# Patient Record
Sex: Female | Born: 1975 | Race: White | Hispanic: No | Marital: Married | Smoking: Former smoker
Health system: Southern US, Community
[De-identification: ages and names within clinical notes are randomized; demographics above are authoritative.]

## PROBLEM LIST (undated history)

## (undated) DIAGNOSIS — Z8742 Personal history of other diseases of the female genital tract: Secondary | ICD-10-CM

## (undated) DIAGNOSIS — G939 Disorder of brain, unspecified: Secondary | ICD-10-CM

## (undated) DIAGNOSIS — D649 Anemia, unspecified: Secondary | ICD-10-CM

## (undated) DIAGNOSIS — N3020 Other chronic cystitis without hematuria: Secondary | ICD-10-CM

## (undated) DIAGNOSIS — L709 Acne, unspecified: Secondary | ICD-10-CM

## (undated) DIAGNOSIS — K589 Irritable bowel syndrome without diarrhea: Secondary | ICD-10-CM

## (undated) DIAGNOSIS — G43109 Migraine with aura, not intractable, without status migrainosus: Secondary | ICD-10-CM

## (undated) DIAGNOSIS — L570 Actinic keratosis: Secondary | ICD-10-CM

## (undated) HISTORY — DX: Migraine with aura, not intractable, without status migrainosus: G43.109

## (undated) HISTORY — DX: Other chronic cystitis without hematuria: N30.20

## (undated) HISTORY — DX: Anemia, unspecified: D64.9

## (undated) HISTORY — DX: Personal history of other diseases of the female genital tract: Z87.42

## (undated) HISTORY — DX: Irritable bowel syndrome, unspecified: K58.9

## (undated) HISTORY — DX: Disorder of brain, unspecified: G93.9

## (undated) HISTORY — DX: Actinic keratosis: L57.0

## (undated) HISTORY — DX: Acne, unspecified: L70.9

## (undated) HISTORY — PX: LAPAROSCOPY: SHX197

---

## 2001-03-15 ENCOUNTER — Ambulatory Visit (HOSPITAL_COMMUNITY): Admission: RE | Admit: 2001-03-15 | Discharge: 2001-03-15 | Payer: Self-pay | Admitting: Obstetrics and Gynecology

## 2001-03-15 ENCOUNTER — Encounter (INDEPENDENT_AMBULATORY_CARE_PROVIDER_SITE_OTHER): Payer: Self-pay

## 2002-06-19 ENCOUNTER — Other Ambulatory Visit: Admission: RE | Admit: 2002-06-19 | Discharge: 2002-06-19 | Payer: Self-pay | Admitting: Obstetrics and Gynecology

## 2002-11-15 ENCOUNTER — Encounter: Admission: RE | Admit: 2002-11-15 | Discharge: 2002-11-15 | Payer: Self-pay | Admitting: Obstetrics and Gynecology

## 2002-11-21 ENCOUNTER — Inpatient Hospital Stay (HOSPITAL_COMMUNITY): Admission: AD | Admit: 2002-11-21 | Discharge: 2002-11-21 | Payer: Self-pay | Admitting: Obstetrics and Gynecology

## 2002-11-21 ENCOUNTER — Encounter: Admission: RE | Admit: 2002-11-21 | Discharge: 2002-11-21 | Payer: Self-pay | Admitting: Obstetrics and Gynecology

## 2002-11-27 ENCOUNTER — Encounter: Admission: RE | Admit: 2002-11-27 | Discharge: 2002-11-27 | Payer: Self-pay | Admitting: Obstetrics and Gynecology

## 2002-12-06 ENCOUNTER — Encounter: Admission: RE | Admit: 2002-12-06 | Discharge: 2002-12-06 | Payer: Self-pay | Admitting: Obstetrics and Gynecology

## 2002-12-11 ENCOUNTER — Encounter: Admission: RE | Admit: 2002-12-11 | Discharge: 2002-12-11 | Payer: Self-pay | Admitting: Obstetrics and Gynecology

## 2002-12-15 ENCOUNTER — Inpatient Hospital Stay (HOSPITAL_COMMUNITY): Admission: AD | Admit: 2002-12-15 | Discharge: 2002-12-15 | Payer: Self-pay | Admitting: Obstetrics and Gynecology

## 2002-12-16 ENCOUNTER — Inpatient Hospital Stay (HOSPITAL_COMMUNITY): Admission: AD | Admit: 2002-12-16 | Discharge: 2002-12-18 | Payer: Self-pay | Admitting: Obstetrics and Gynecology

## 2002-12-16 ENCOUNTER — Encounter (INDEPENDENT_AMBULATORY_CARE_PROVIDER_SITE_OTHER): Payer: Self-pay | Admitting: Specialist

## 2003-01-16 ENCOUNTER — Other Ambulatory Visit: Admission: RE | Admit: 2003-01-16 | Discharge: 2003-01-16 | Payer: Self-pay | Admitting: Obstetrics and Gynecology

## 2004-01-31 ENCOUNTER — Ambulatory Visit: Payer: Self-pay | Admitting: Internal Medicine

## 2004-01-31 LAB — HM COLONOSCOPY

## 2004-05-05 ENCOUNTER — Other Ambulatory Visit: Admission: RE | Admit: 2004-05-05 | Discharge: 2004-05-05 | Payer: Self-pay | Admitting: Obstetrics and Gynecology

## 2005-05-13 ENCOUNTER — Other Ambulatory Visit: Admission: RE | Admit: 2005-05-13 | Discharge: 2005-05-13 | Payer: Self-pay | Admitting: Obstetrics and Gynecology

## 2009-09-24 ENCOUNTER — Inpatient Hospital Stay (HOSPITAL_COMMUNITY): Admission: AD | Admit: 2009-09-24 | Discharge: 2009-09-27 | Payer: Self-pay | Admitting: Obstetrics and Gynecology

## 2009-10-01 ENCOUNTER — Inpatient Hospital Stay (HOSPITAL_COMMUNITY): Admission: AD | Admit: 2009-10-01 | Discharge: 2009-10-03 | Payer: Self-pay | Admitting: Obstetrics and Gynecology

## 2009-10-01 ENCOUNTER — Encounter (INDEPENDENT_AMBULATORY_CARE_PROVIDER_SITE_OTHER): Payer: Self-pay | Admitting: Obstetrics and Gynecology

## 2009-10-02 ENCOUNTER — Encounter: Admission: RE | Admit: 2009-10-02 | Discharge: 2009-10-22 | Payer: Self-pay | Admitting: Obstetrics and Gynecology

## 2009-11-02 ENCOUNTER — Encounter: Admission: RE | Admit: 2009-11-02 | Discharge: 2009-12-02 | Payer: Self-pay | Admitting: Obstetrics and Gynecology

## 2009-12-03 ENCOUNTER — Encounter: Admission: RE | Admit: 2009-12-03 | Discharge: 2009-12-29 | Payer: Self-pay | Admitting: Obstetrics and Gynecology

## 2010-07-12 LAB — URINALYSIS, ROUTINE W REFLEX MICROSCOPIC
Ketones, ur: NEGATIVE mg/dL
Leukocytes, UA: NEGATIVE
Nitrite: NEGATIVE
Protein, ur: NEGATIVE mg/dL
Specific Gravity, Urine: 1.02 (ref 1.005–1.030)
Urobilinogen, UA: 0.2 mg/dL (ref 0.0–1.0)
pH: 7 (ref 5.0–8.0)

## 2010-07-12 LAB — KLEIHAUER-BETKE STAIN: Quantitation Fetal Hemoglobin: 0 mL

## 2010-07-12 LAB — CBC
HCT: 29.6 % — ABNORMAL LOW (ref 36.0–46.0)
Hemoglobin: 11.1 g/dL — ABNORMAL LOW (ref 12.0–15.0)
MCV: 95.8 fL (ref 78.0–100.0)
MCV: 96.9 fL (ref 78.0–100.0)
Platelets: 202 10*3/uL (ref 150–400)
RBC: 3.69 MIL/uL — ABNORMAL LOW (ref 3.87–5.11)
RDW: 13.1 % (ref 11.5–15.5)
RDW: 13.2 % (ref 11.5–15.5)
WBC: 12.8 10*3/uL — ABNORMAL HIGH (ref 4.0–10.5)

## 2010-07-12 LAB — WET PREP, GENITAL
Clue Cells Wet Prep HPF POC: NONE SEEN
Trich, Wet Prep: NONE SEEN

## 2010-07-12 LAB — RPR: RPR Ser Ql: NONREACTIVE

## 2010-09-10 NOTE — Op Note (Signed)
[facility]  Patient:    Kathryn Maynard, Kathryn Maynard Visit Number: [id number] MRN: [id number]          Service Type: DSU Location: [facility] Attending Physician:  Frederich Balding Dictated by:   Juluis Mire, M.D. Proc. Date: 03/15/01 Admit Date:  03/15/2001                             Operative Report  PREOPERATIVE DIAGNOSES:       Abnormal bleeding and pelvic pain.  POSTOPERATIVE DIAGNOSES:      Minimal pelvic endometriosis.  OPERATIVE PROCEDURE:          Hysteroscopy with endometrial biopsies and curettings, laparoscopy with laser ablation of endometriotic implants.  SURGEON:                      Juluis Mire, M.D.  ANESTHESIA:                   General endotracheal.  ESTIMATED BLOOD LOSS:         Minimal.  PACKS AND DRAINS:             None.  INTRAOPERATIVE BLOOD PLACED:  None.  COMPLICATIONS:                None.  INDICATIONS:                  Dictated in the history and physical.  PROCEDURE:                    Patient was taken to the OR and placed in the supine position.  After a satisfactory level of general endotracheal anesthesia was obtained patient was placed in the dorsal lithotomy position using the Allen stirrups.  The abdomen, perineum, and vagina were prepped out with Betadine.  Patient was draped out for hysteroscopy.  Examination under anesthesia revealed the uterus to be anterior, normal size and shape.  Adnexa was unremarkable.  Speculum was placed in the vaginal vault.  The cervix was grasped with a single tooth tenaculum.  Uterus sounded to 8 cm.  Cervix was serially dilated to a size 35 Pratt dilator.  The operative hysteroscope was then introduced.  Intrauterine cavity was distended using sorbitol. Visualization revealed no evidence of polyps or fibroids.  She did have thin, normal appearing endometrium.  Two biopsies were obtained along with endometrial curettings.  The hysteroscope was then removed.  The Hulka tenaculum  was then put in place.  Patients bladder was then emptied by in-and-out catheterization.  Patient was then draped out for laparoscopy.  Subumbilical incision was made with the knife.  The Veress needle was introduced into the abdominal cavity.  Abdomen was insufflated with approximately 3 L of carbon dioxide.  The operating laparoscope was introduced.  Visualization revealed no evidence of injury to adjacent organs. A 5 mm trocar was put in place in the suprapubic area under direct visualization.  The appendix was retrocecal, but normal.  Upper abdomen including liver and tip of the gallbladder were clear.  Uterus was of normal size and shape.  It did have superficial implants of endometriosis on the anterior and fundal area.  These were small whitish discolored papules.  She had two papules along the left uterosacral ligament.  The ovaries and tubes were unremarkable and not involved in any type of adhesions or endometriotic implants.  Using the ______ laser  with small rounded sapphire tip, the areas of endometriosis on the anterior and fundal area of the uterus were ablated superficially and the two along the left uterosacral ligament were ablated. At the end of the procedure we had to ablate all active endometriosis.  There was no active bleeding or evidence of injury to adjacent organs.  We thoroughly irrigated the pelvis.  The irrigation was then removed.  The patient was taken out of the dorsal lithotomy position.  Once she was alert and extubated, she was transferred to the recovery room in good condition. The Hulka tenaculum had been removed and sponge, instrument, and needle count was reported correct by the circulated nurse x 2. Dictated by:   Juluis Mire, M.D. Attending Physician:  Frederich Balding DD:  03/15/01 TD:  03/15/01 Job: [id number]

## 2010-09-10 NOTE — H&P (Signed)
[facility]  PatientTRELLIS, GUIRGUIS Visit Number: [id number] MRN: [id number]          Service Type: Attending:  Juluis Mire, M.D. Dictated by:   Juluis Mire, M.D. Adm. Date:  03/15/01                           History and Physical  REASON FOR ADMISSION:         The patient is a 35 year old gravida 2 para 1 abortus 1 married white female who presents for a hysteroscopy along with diagnostic laparoscopy with laser standby.  HISTORY OF PRESENT ILLNESS:   Patient initially seen in our practice on September 26, 2000.  At that time she was on birth control pills.  She was having problems with intermenstrual spotting.  This bleeding seems to increase with intercourse and was associated with increasing right lower quadrant pain. During deep penetration she had some fairly significant discomfort that at times was becoming fairly limiting for her.  Due to the abnormal bleeding on the pills and the pelvic pain and dyspareunia, we switched her pills around and treated her with antibiotics.  We also did an ultrasound.  On ultrasound there was no evidence of any intrauterine pathology; specifically, no polyps and fibroids and both ovaries appeared to be normal.  The patient continued to have some problems with breakthrough bleeding and particularly pain with intercourse, and because of this she now presents to rule out endometriosis and is set up for the above-noted surgery.  ALLERGIES:                    PENICILLIN.  MEDICATIONS:                  Birth control pills.  PAST MEDICAL HISTORY:         History of frequent urinary tract infection. Otherwise, usual childhood diseases.  SURGICAL HISTORY:             She has had one D&C for a spontaneous abortion in 1997.  OBSTETRICAL HISTORY:          She has had one spontaneous vaginal delivery.  SOCIAL HISTORY:               She does smoke a half a pack per day.  No alcohol use.  FAMILY HISTORY:                Paternal grandmother with diabetes.  Mother, father, and sister all have hypertension.  Paternal grandmother with heart disease.  REVIEW OF SYSTEMS:            Noncontributory.  PHYSICAL EXAMINATION:  VITAL SIGNS:                  Patient is afebrile with stable vital signs.  HEENT:                        Patient normocephalic.  Pupils equal, round, and reactive to light and accommodation.  Extraocular movements were intact. Sclerae and conjunctivae were clear.  Oropharynx clear.  NECK:                         Without thyromegaly.  BREASTS:                      Not examined.  LUNGS:  Clear.  CARDIOVASCULAR:               Regular rhythm and rate, no murmurs or gallops.  ABDOMEN:                      Benign.  PELVIC:                       Normal external genitalia, vaginal mucosa is clear.  Cervix unremarkable.  Uterus feels to be mid position, mild cul-de-sac tenderness.  Adnexa unremarkable.  EXTREMITIES:                  Trace edema.  NEUROLOGIC:                   Grossly within normal limits.  IMPRESSION:                   Abnormal uterine bleeding on pills, as well as increasing pelvic pain and dyspareunia, rule out endometriosis.  PLAN:                         The patient to undergo hysteroscopy along with diagnostic laparoscopy with laser standby for evaluation.  The risks have been discussed, including the risk of anesthesia; the risk of infection; the risk of hemorrhage that could require transfusion with the risk of AIDS or hepatitis; the risk of injury to adjacent organs including bladder, bowel, or ureters that could require further exploratory surgery; the risk of deep venous thrombosis and pulmonary embolus.  The patient professed an understanding of the indications and risks. Dictated by:   Juluis Mire, M.D. Attending:  Juluis Mire, M.D. DD:  03/15/01 TD:  03/15/01 Job: [id number]

## 2011-06-13 ENCOUNTER — Ambulatory Visit: Payer: Self-pay | Admitting: Internal Medicine

## 2014-03-07 ENCOUNTER — Ambulatory Visit: Payer: Self-pay | Admitting: Physician Assistant

## 2014-06-03 ENCOUNTER — Ambulatory Visit: Payer: Self-pay | Admitting: Otolaryngology

## 2015-09-15 DIAGNOSIS — R310 Gross hematuria: Secondary | ICD-10-CM | POA: Diagnosis not present

## 2015-09-15 DIAGNOSIS — R3989 Other symptoms and signs involving the genitourinary system: Secondary | ICD-10-CM | POA: Diagnosis not present

## 2015-09-15 DIAGNOSIS — Z6821 Body mass index (BMI) 21.0-21.9, adult: Secondary | ICD-10-CM | POA: Diagnosis not present

## 2015-09-15 DIAGNOSIS — N3020 Other chronic cystitis without hematuria: Secondary | ICD-10-CM | POA: Diagnosis not present

## 2015-10-08 DIAGNOSIS — K7689 Other specified diseases of liver: Secondary | ICD-10-CM | POA: Diagnosis not present

## 2015-10-08 DIAGNOSIS — K5730 Diverticulosis of large intestine without perforation or abscess without bleeding: Secondary | ICD-10-CM | POA: Diagnosis not present

## 2015-10-08 DIAGNOSIS — R310 Gross hematuria: Secondary | ICD-10-CM | POA: Diagnosis not present

## 2015-12-09 DIAGNOSIS — Z6821 Body mass index (BMI) 21.0-21.9, adult: Secondary | ICD-10-CM | POA: Diagnosis not present

## 2015-12-09 DIAGNOSIS — G43909 Migraine, unspecified, not intractable, without status migrainosus: Secondary | ICD-10-CM | POA: Diagnosis not present

## 2015-12-09 DIAGNOSIS — Z01419 Encounter for gynecological examination (general) (routine) without abnormal findings: Secondary | ICD-10-CM | POA: Diagnosis not present

## 2016-04-13 DIAGNOSIS — J069 Acute upper respiratory infection, unspecified: Secondary | ICD-10-CM | POA: Diagnosis not present

## 2016-05-20 ENCOUNTER — Encounter: Payer: Self-pay | Admitting: Family Medicine

## 2016-05-20 ENCOUNTER — Ambulatory Visit (INDEPENDENT_AMBULATORY_CARE_PROVIDER_SITE_OTHER): Payer: BLUE CROSS/BLUE SHIELD | Admitting: Family Medicine

## 2016-05-20 VITALS — BP 143/91 | HR 63 | Temp 98.0°F | Wt 119.0 lb

## 2016-05-20 DIAGNOSIS — N3020 Other chronic cystitis without hematuria: Secondary | ICD-10-CM

## 2016-05-20 DIAGNOSIS — G939 Disorder of brain, unspecified: Secondary | ICD-10-CM

## 2016-05-20 DIAGNOSIS — D649 Anemia, unspecified: Secondary | ICD-10-CM | POA: Insufficient documentation

## 2016-05-20 DIAGNOSIS — K589 Irritable bowel syndrome without diarrhea: Secondary | ICD-10-CM | POA: Insufficient documentation

## 2016-05-20 DIAGNOSIS — N390 Urinary tract infection, site not specified: Secondary | ICD-10-CM | POA: Diagnosis not present

## 2016-05-20 DIAGNOSIS — G43109 Migraine with aura, not intractable, without status migrainosus: Secondary | ICD-10-CM | POA: Insufficient documentation

## 2016-05-20 DIAGNOSIS — R399 Unspecified symptoms and signs involving the genitourinary system: Secondary | ICD-10-CM | POA: Diagnosis not present

## 2016-05-20 MED ORDER — CIPROFLOXACIN HCL 250 MG PO TABS: 250.0000 mg | ORAL_TABLET | Freq: Two times a day (BID) | ORAL | 0 refills | Status: DC

## 2016-05-20 MED ORDER — PHENAZOPYRIDINE HCL 95 MG PO TABS: 95.0000 mg | ORAL_TABLET | Freq: Three times a day (TID) | ORAL | 0 refills | Status: DC | PRN

## 2016-05-20 NOTE — Patient Instructions (Signed)
Follow up as needed

## 2016-05-20 NOTE — Progress Notes (Signed)
   BP (!) 143/91   Pulse 63   Temp 98 F (36.7 C)   Wt 119 lb (54 kg)   SpO2 95%   BMI 23.24 kg/m    Subjective:    Patient ID: Kathryn Maynard, female    DOB: [birth year], 41 y.o.   MRN: [id number]  HPI: Kathryn Maynard is a 41 y.o. female  Chief Complaint  Patient presents with  . Urinary Tract Infection    x 1 week, burning, frequency, urgency, only a dribble at a time. abdomen fullness, uncomfortable.   Patient presents with about 1 week of urinary frequency, urgency, dysuria, hematuria, lbp, and pelvic pressure. Denies N/V, fever, chills. Has a long hx of UTIs, full work-up with Urology last year negative. Was on suppressive abx for 3 months in the fall but has since stopped d/t yeast infections. Had one antibiotic tablet left from a previous script that she took this morning for current sxs.  Relevant past medical, surgical, family and social history reviewed and updated as indicated. Interim medical history since our last visit reviewed. Allergies and medications reviewed and updated.  Review of Systems  Constitutional: Negative.   HENT: Negative.   Eyes: Negative.   Respiratory: Negative.   Cardiovascular: Negative.   Gastrointestinal: Negative.   Genitourinary: Positive for dysuria, frequency, hematuria, pelvic pain (pressure) and urgency.  Musculoskeletal: Positive for back pain.  Neurological: Negative.   Psychiatric/Behavioral: Negative.     Per HPI unless specifically indicated above     Objective:    BP (!) 143/91   Pulse 63   Temp 98 F (36.7 C)   Wt 119 lb (54 kg)   SpO2 95%   BMI 23.24 kg/m   Wt Readings from Last 3 Encounters:  05/20/16 119 lb (54 kg)  05/22/14 118 lb (53.5 kg)    Physical Exam  Constitutional: She is oriented to person, place, and time. She appears well-developed and well-nourished. No distress.  HENT:  Head: Atraumatic.  Eyes: Conjunctivae are normal. Pupils are equal, round, and reactive to light.  Neck: Normal  range of motion. Neck supple.  Cardiovascular: Normal rate and normal heart sounds.   Pulmonary/Chest: Effort normal and breath sounds normal. No respiratory distress.  Musculoskeletal: Normal range of motion.  No CVA tenderness  Neurological: She is alert and oriented to person, place, and time.  Skin: Skin is warm and dry.  Psychiatric: She has a normal mood and affect. Her behavior is normal.  Nursing note and vitals reviewed.     Assessment & Plan:   Problem List Items Addressed This Visit    None    Visit Diagnoses    Lower urinary tract infection    -  Primary   U/A +, will start cipro and await cx. Discussed probiotic, cranberry, push fluids, uristat for pain. F/u with Urology if symptoms return   Relevant Medications   phenazopyridine (URISTAT) 95 MG tablet   Other Relevant Orders   UA/M w/rflx Culture, Routine (STAT)       Follow up plan: Return if symptoms worsen or fail to improve.

## 2016-05-27 ENCOUNTER — Telehealth: Payer: Self-pay | Admitting: Family Medicine

## 2016-05-27 LAB — UA/M W/RFLX CULTURE, ROUTINE
BILIRUBIN UA: NEGATIVE
Glucose, UA: NEGATIVE
KETONES UA: NEGATIVE
Nitrite, UA: NEGATIVE
Specific Gravity, UA: 1.025 (ref 1.005–1.030)
UUROB: 0.2 mg/dL (ref 0.2–1.0)
pH, UA: 5.5 (ref 5.0–7.5)

## 2016-05-27 LAB — URINE CULTURE, REFLEX

## 2016-05-27 LAB — MICROSCOPIC EXAMINATION

## 2016-05-27 NOTE — Telephone Encounter (Signed)
Patient notified. Advised her to let us know if her symptoms return.

## 2016-05-27 NOTE — Telephone Encounter (Signed)
Please call patient and let her know that her urine culture came back showing that the bacteria is susceptible to the cipro, so we shouldn't have to make any changes. She should return for another U/A if still symptomatic after completing course. Thanks

## 2016-06-27 DIAGNOSIS — N760 Acute vaginitis: Secondary | ICD-10-CM | POA: Diagnosis not present

## 2016-06-27 DIAGNOSIS — N390 Urinary tract infection, site not specified: Secondary | ICD-10-CM | POA: Diagnosis not present

## 2016-08-01 DIAGNOSIS — R3129 Other microscopic hematuria: Secondary | ICD-10-CM | POA: Diagnosis not present

## 2016-08-01 DIAGNOSIS — R310 Gross hematuria: Secondary | ICD-10-CM | POA: Diagnosis not present

## 2016-08-01 DIAGNOSIS — R300 Dysuria: Secondary | ICD-10-CM | POA: Diagnosis not present

## 2016-08-01 DIAGNOSIS — Z6821 Body mass index (BMI) 21.0-21.9, adult: Secondary | ICD-10-CM | POA: Diagnosis not present

## 2016-08-01 DIAGNOSIS — R3915 Urgency of urination: Secondary | ICD-10-CM | POA: Diagnosis not present

## 2016-08-01 DIAGNOSIS — N3020 Other chronic cystitis without hematuria: Secondary | ICD-10-CM | POA: Diagnosis not present

## 2016-08-02 DIAGNOSIS — R310 Gross hematuria: Secondary | ICD-10-CM | POA: Diagnosis not present

## 2016-08-02 DIAGNOSIS — N3020 Other chronic cystitis without hematuria: Secondary | ICD-10-CM | POA: Diagnosis not present

## 2016-08-02 DIAGNOSIS — R3989 Other symptoms and signs involving the genitourinary system: Secondary | ICD-10-CM | POA: Diagnosis not present

## 2016-08-02 DIAGNOSIS — Z6821 Body mass index (BMI) 21.0-21.9, adult: Secondary | ICD-10-CM | POA: Diagnosis not present

## 2016-10-31 DIAGNOSIS — N760 Acute vaginitis: Secondary | ICD-10-CM | POA: Diagnosis not present

## 2016-10-31 DIAGNOSIS — Z87440 Personal history of urinary (tract) infections: Secondary | ICD-10-CM | POA: Diagnosis not present

## 2016-10-31 DIAGNOSIS — Z01818 Encounter for other preprocedural examination: Secondary | ICD-10-CM | POA: Diagnosis not present

## 2016-12-22 DIAGNOSIS — Z01419 Encounter for gynecological examination (general) (routine) without abnormal findings: Secondary | ICD-10-CM | POA: Diagnosis not present

## 2016-12-22 DIAGNOSIS — Z6821 Body mass index (BMI) 21.0-21.9, adult: Secondary | ICD-10-CM | POA: Diagnosis not present

## 2016-12-28 DIAGNOSIS — Z1231 Encounter for screening mammogram for malignant neoplasm of breast: Secondary | ICD-10-CM | POA: Diagnosis not present

## 2017-05-18 DIAGNOSIS — L811 Chloasma: Secondary | ICD-10-CM | POA: Diagnosis not present

## 2017-05-18 DIAGNOSIS — Z85828 Personal history of other malignant neoplasm of skin: Secondary | ICD-10-CM | POA: Diagnosis not present

## 2017-05-18 DIAGNOSIS — L700 Acne vulgaris: Secondary | ICD-10-CM | POA: Diagnosis not present

## 2018-02-18 ENCOUNTER — Ambulatory Visit
Admission: EM | Admit: 2018-02-18 | Discharge: 2018-02-18 | Disposition: A | Discharge status: Home / Self Care | Payer: BLUE CROSS/BLUE SHIELD | Attending: Family Medicine | Admitting: Family Medicine

## 2018-02-18 ENCOUNTER — Other Ambulatory Visit: Payer: Self-pay

## 2018-02-18 DIAGNOSIS — J029 Acute pharyngitis, unspecified: Secondary | ICD-10-CM

## 2018-02-18 LAB — RAPID STREP SCREEN (MED CTR MEBANE ONLY): Streptococcus, Group A Screen (Direct): NEGATIVE

## 2018-02-18 MED ORDER — LIDOCAINE VISCOUS HCL 2 % MT SOLN: OROMUCOSAL | 0 refills | Status: DC

## 2018-02-18 NOTE — ED Provider Notes (Signed)
[facility]    CSN: [id number] Arrival date & time: 02/18/18  1023  History   Chief Complaint Chief Complaint  Patient presents with  . Sore Throat   HPI  42 year old female presents with sore throat and headache.  Also reports ear pain.  Started yesterday.  Reports severe sore throat.  Associated headache.  Associated ear pain, particularly the right ear.  No fever.  No reported sick contacts.  No known exacerbating or relieving factors.  No other complaints.  Past Medical History:  Diagnosis Date  . Anemia   . Chronic cystitis   . History of endometriosis   . IBS (irritable bowel syndrome)   . Lesion of pons    followed by neuro  . Migraine headache with aura followed by neuro   Patient Active Problem List   Diagnosis Date Noted  . Migraine headache with aura   . Lesion of pons   . Anemia   . IBS (irritable bowel syndrome)   . Chronic cystitis    Past Surgical History:  Procedure Laterality Date  . LAPAROSCOPY     for endometriosis   OB History   None    Home Medications    Prior to Admission medications   Medication Sig Start Date End Date Taking? Authorizing Provider  doxycycline (VIBRA-TABS) 100 MG tablet TAKE 1 TABLET BY MOUTH DAILY WITH FOOD AND PLENTY OF FLUID 02/02/18   [provider]  lidocaine (XYLOCAINE) 2 % solution Gargle 15 mL every 3 hours as needed. May swallow if desired. 02/18/18   Coral Spikes, DO  norethindrone-ethinyl estradiol (MICROGESTIN,JUNEL,LOESTRIN) 1-20 MG-MCG tablet Take by mouth daily. 05/31/14   [provider]  spironolactone (ALDACTONE) 25 MG tablet Take 25 mg by mouth daily. 01/22/18   [provider]  SUMAtriptan (IMITREX) 100 MG tablet  02/08/18   [provider]   Family History Family History  Problem Relation Age of Onset  . Heart disease Mother   . Heart attack Mother   . Diabetes Father   . Hypertension Father   . Diabetes Sister   . Hypertension Sister   . Drug  abuse Paternal Grandmother    Social History Social History   Tobacco Use  . Smoking status: Former Research scientist (life sciences)  . Smokeless tobacco: Never Used  . Tobacco comment: use no nicotine vapor  Substance Use Topics  . Alcohol use: No  . Drug use: No   Allergies   Penicillin g   Review of Systems Review of Systems  Constitutional: Negative for fever.  HENT: Positive for ear pain and sore throat.   Neurological: Positive for headaches.   Physical Exam Triage Vital Signs ED Triage Vitals  Enc Vitals Group     BP 02/18/18 1040 116/73     Pulse Rate 02/18/18 1040 66     Resp 02/18/18 1040 16     Temp 02/18/18 1040 98.3 F (36.8 C)     Temp Source 02/18/18 1040 Oral     SpO2 02/18/18 1040 100 %     Weight 02/18/18 1036 120 lb (54.4 kg)     Height 02/18/18 1036 5' 1"  (1.549 m)     Head Circumference --      Peak Flow --      Pain Score 02/18/18 1035 5     Pain Loc --      Pain Edu? --      Excl. in [place]? --    Updated Vital Signs BP 116/73 (BP Location:  Left Arm)   Pulse 66   Temp 98.3 F (36.8 C) (Oral)   Resp 16   Ht 5' 1"  (1.549 m)   Wt 54.4 kg   SpO2 100%   BMI 22.67 kg/m   Visual Acuity Right Eye Distance:   Left Eye Distance:   Bilateral Distance:    Right Eye Near:   Left Eye Near:    Bilateral Near:     Physical Exam  Constitutional: She is oriented to person, place, and time. She appears well-developed. No distress.  HENT:  Head: Normocephalic and atraumatic.  Oropharynx with mild erythema.  Tonsillith noted on the right. Right TM obscured by cerumen.  Left TM normal.  After cerumen disimpaction, right TM normal.  Cardiovascular: Normal rate and regular rhythm.  Pulmonary/Chest: Effort normal and breath sounds normal. She has no wheezes. She has no rales.  Neurological: She is alert and oriented to person, place, and time.  Psychiatric: She has a normal mood and affect. Her behavior is normal.  Nursing note and vitals reviewed.  UC Treatments / Results   Labs (all labs ordered are listed, but only abnormal results are displayed) Labs Reviewed  RAPID STREP SCREEN ([facility] ONLY)  CULTURE, GROUP A STREP [facility])    EKG None  Radiology No results found.  Procedures Procedures (including critical care time)  Medications Ordered in UC Medications - No data to display  Initial Impression / Assessment and Plan / UC Course  I have reviewed the triage vital signs and the nursing notes.  Pertinent labs & imaging results that were available during my care of the patient were reviewed by me and considered in my medical decision making (see chart for details).    42 year old female presents with viral pharyngitis.  Viscous lidocaine as needed.  Supportive care.  Final Clinical Impressions(s) / UC Diagnoses   Final diagnoses:  Viral pharyngitis     Discharge Instructions     Rest, fluids.  Warm salt water gargles. Ibuprofen as needed.   Medication as prescribed.  Take care  Dr. Lacinda Axon    ED Prescriptions    Medication Sig Dispense Auth. Provider   lidocaine (XYLOCAINE) 2 % solution Gargle 15 mL every 3 hours as needed. May swallow if desired. 200 mL Coral Spikes, DO     Controlled Substance Prescriptions [place] Controlled Substance Registry consulted? Not Applicable   Coral Spikes, DO 02/18/18 1303

## 2018-02-18 NOTE — ED Triage Notes (Signed)
Patient with sore throat pain since yesterday and radiating into right ear and head. Pain 5/10

## 2018-02-18 NOTE — Discharge Instructions (Signed)
Rest, fluids.  Warm salt water gargles. Ibuprofen as needed.   Medication as prescribed.  Take care  Dr. Lacinda Axon

## 2018-02-21 LAB — CULTURE, GROUP A STREP (THRC)

## 2018-02-23 ENCOUNTER — Telehealth (HOSPITAL_COMMUNITY): Payer: Self-pay

## 2018-02-23 NOTE — Telephone Encounter (Signed)
Culture is positive for non group A Strep germ.  This is a finding of uncertain significance; not the typical 'strep throat' germ.  Pt reports feeling better.

## 2018-04-23 DIAGNOSIS — L700 Acne vulgaris: Secondary | ICD-10-CM | POA: Diagnosis not present

## 2018-06-27 DIAGNOSIS — L570 Actinic keratosis: Secondary | ICD-10-CM | POA: Diagnosis not present

## 2018-06-27 DIAGNOSIS — L700 Acne vulgaris: Secondary | ICD-10-CM | POA: Diagnosis not present

## 2018-08-30 DIAGNOSIS — L700 Acne vulgaris: Secondary | ICD-10-CM | POA: Diagnosis not present

## 2018-08-30 DIAGNOSIS — Z79899 Other long term (current) drug therapy: Secondary | ICD-10-CM | POA: Diagnosis not present

## 2018-08-30 DIAGNOSIS — L578 Other skin changes due to chronic exposure to nonionizing radiation: Secondary | ICD-10-CM | POA: Diagnosis not present

## 2018-08-30 DIAGNOSIS — L570 Actinic keratosis: Secondary | ICD-10-CM | POA: Diagnosis not present

## 2018-10-01 DIAGNOSIS — L570 Actinic keratosis: Secondary | ICD-10-CM | POA: Diagnosis not present

## 2018-10-01 DIAGNOSIS — Z79899 Other long term (current) drug therapy: Secondary | ICD-10-CM | POA: Diagnosis not present

## 2018-10-01 DIAGNOSIS — L700 Acne vulgaris: Secondary | ICD-10-CM | POA: Diagnosis not present

## 2018-11-05 DIAGNOSIS — Z79899 Other long term (current) drug therapy: Secondary | ICD-10-CM | POA: Diagnosis not present

## 2018-11-05 DIAGNOSIS — L700 Acne vulgaris: Secondary | ICD-10-CM | POA: Diagnosis not present

## 2018-11-05 DIAGNOSIS — L853 Xerosis cutis: Secondary | ICD-10-CM | POA: Diagnosis not present

## 2018-11-05 DIAGNOSIS — D485 Neoplasm of uncertain behavior of skin: Secondary | ICD-10-CM | POA: Diagnosis not present

## 2018-11-05 DIAGNOSIS — L570 Actinic keratosis: Secondary | ICD-10-CM | POA: Diagnosis not present

## 2018-11-09 DIAGNOSIS — H16102 Unspecified superficial keratitis, left eye: Secondary | ICD-10-CM | POA: Diagnosis not present

## 2018-12-12 DIAGNOSIS — L700 Acne vulgaris: Secondary | ICD-10-CM | POA: Diagnosis not present

## 2018-12-12 DIAGNOSIS — Z79899 Other long term (current) drug therapy: Secondary | ICD-10-CM | POA: Diagnosis not present

## 2018-12-12 DIAGNOSIS — L570 Actinic keratosis: Secondary | ICD-10-CM | POA: Diagnosis not present

## 2018-12-12 DIAGNOSIS — L400 Psoriasis vulgaris: Secondary | ICD-10-CM | POA: Diagnosis not present

## 2019-01-14 DIAGNOSIS — Z872 Personal history of diseases of the skin and subcutaneous tissue: Secondary | ICD-10-CM | POA: Diagnosis not present

## 2019-01-14 DIAGNOSIS — R591 Generalized enlarged lymph nodes: Secondary | ICD-10-CM | POA: Diagnosis not present

## 2019-01-14 DIAGNOSIS — L700 Acne vulgaris: Secondary | ICD-10-CM | POA: Diagnosis not present

## 2019-01-14 DIAGNOSIS — L578 Other skin changes due to chronic exposure to nonionizing radiation: Secondary | ICD-10-CM | POA: Diagnosis not present

## 2019-02-14 DIAGNOSIS — L853 Xerosis cutis: Secondary | ICD-10-CM | POA: Diagnosis not present

## 2019-02-14 DIAGNOSIS — Z872 Personal history of diseases of the skin and subcutaneous tissue: Secondary | ICD-10-CM | POA: Diagnosis not present

## 2019-02-14 DIAGNOSIS — L700 Acne vulgaris: Secondary | ICD-10-CM | POA: Diagnosis not present

## 2019-02-14 DIAGNOSIS — Z79899 Other long term (current) drug therapy: Secondary | ICD-10-CM | POA: Diagnosis not present

## 2019-02-15 DIAGNOSIS — Z01419 Encounter for gynecological examination (general) (routine) without abnormal findings: Secondary | ICD-10-CM | POA: Diagnosis not present

## 2019-02-15 DIAGNOSIS — Z6823 Body mass index (BMI) 23.0-23.9, adult: Secondary | ICD-10-CM | POA: Diagnosis not present

## 2019-03-18 DIAGNOSIS — L700 Acne vulgaris: Secondary | ICD-10-CM | POA: Diagnosis not present

## 2019-03-18 DIAGNOSIS — L853 Xerosis cutis: Secondary | ICD-10-CM | POA: Diagnosis not present

## 2019-03-18 DIAGNOSIS — L539 Erythematous condition, unspecified: Secondary | ICD-10-CM | POA: Diagnosis not present

## 2019-03-18 DIAGNOSIS — Z79899 Other long term (current) drug therapy: Secondary | ICD-10-CM | POA: Diagnosis not present

## 2019-04-11 DIAGNOSIS — Z872 Personal history of diseases of the skin and subcutaneous tissue: Secondary | ICD-10-CM | POA: Diagnosis not present

## 2019-04-11 DIAGNOSIS — L700 Acne vulgaris: Secondary | ICD-10-CM | POA: Diagnosis not present

## 2019-04-11 DIAGNOSIS — Z79899 Other long term (current) drug therapy: Secondary | ICD-10-CM | POA: Diagnosis not present

## 2019-04-16 ENCOUNTER — Ambulatory Visit: Payer: Self-pay

## 2019-04-16 NOTE — Telephone Encounter (Signed)
Pt experienced burning with urination this morning and bladder pressure. Pt has a hx of chronic UTI's. Pt has appointment tomorrow but was calling for advice on how to manage sx until on abx.  Pt took a dose of Uristat prior to call. Advised pt to drink plenty of water, avoid coffee, alcohol and soft drinks that contain citrus juices and caffeine. Advised pt to place a heating pad over her abdomen to help ease pressure.  Pt verbalized understanding.  Reason for Disposition . [1] Painful urination AND [2] EITHER frequency or urgency AND [3] has on-call doctor  Answer Assessment - Initial Assessment Questions 1. SYMPTOM: "What's the main symptom you're concerned about?" (e.g., frequency, incontinence)     Burning with urination 2. ONSET: "When did the  burning  start?"     This am 3. PAIN: "Is there any pain?" If so, ask: "How bad is it?" (Scale: 1-10; mild, moderate, severe)     mild 4. CAUSE: "What do you think is causing the symptoms?"     UTI 5. OTHER SYMPTOMS: "Do you have any other symptoms?" (e.g., fever, flank pain, blood in urine, pain with urination)    Pain with urination and bladder pressure 6. PREGNANCY: "Is there any chance you are pregnant?" "When was your last menstrual period?"  Protocols used: [place], URINARY [facility]

## 2019-04-17 ENCOUNTER — Ambulatory Visit: Payer: BC Managed Care – PPO | Admitting: Family Medicine

## 2019-04-17 ENCOUNTER — Encounter: Payer: Self-pay | Admitting: Family Medicine

## 2019-04-17 ENCOUNTER — Other Ambulatory Visit: Payer: Self-pay

## 2019-04-17 VITALS — BP 115/74 | HR 69 | Temp 99.0°F | Ht 61.0 in | Wt 124.0 lb

## 2019-04-17 DIAGNOSIS — N390 Urinary tract infection, site not specified: Secondary | ICD-10-CM

## 2019-04-17 DIAGNOSIS — R42 Dizziness and giddiness: Secondary | ICD-10-CM | POA: Diagnosis not present

## 2019-04-17 DIAGNOSIS — R300 Dysuria: Secondary | ICD-10-CM | POA: Diagnosis not present

## 2019-04-17 MED ORDER — FLUCONAZOLE 150 MG PO TABS
150.0000 mg | ORAL_TABLET | Freq: Every day | ORAL | 0 refills | Status: DC
Start: 2019-04-17 — End: 2023-06-22

## 2019-04-17 MED ORDER — NITROFURANTOIN MONOHYD MACRO 100 MG PO CAPS
100.0000 mg | ORAL_CAPSULE | Freq: Two times a day (BID) | ORAL | 0 refills | Status: DC
Start: 2019-04-17 — End: 2023-06-22

## 2019-04-17 NOTE — Progress Notes (Signed)
BP 115/74   Pulse 69   Temp 99 F (37.2 C) (Oral)   Ht 5' 1"  (1.549 m)   Wt 124 lb (56.2 kg)   SpO2 98%   BMI 23.43 kg/m    Subjective:    Patient ID: Kathryn Maynard, female    DOB: 09-18-1975, 43 y.o.   MRN: [id number]  HPI: Kathryn Maynard is a 43 y.o. female  Chief Complaint  Patient presents with  . Dysuria    pressure, burning and discomfort when urinate since yesterday   Patient presenting today with 1 day of pelvic pressure, dysuria, and urinary frequency. Long hx of issues with UTIs, but has not had one in over a year now. Denies fever, chills, N/V/D, concern for STI or pregnancy. Trying AZO with mild temporary relief.   Notes an episode several days ago that shook her up where she was sitting at her desk at work when she all of a sudden had a lightheadedness spell and the sensation of falling, then room spinning dizziness and poor equilibrium. Sxs resolved within seconds per patient and has not had any recurrences since this. Did not go to ED at time and does not wish to at this time unless absolutely having to. Denies confusion, syncope, current headache. Does have hx of migraines for which she takes imitrex prn. No other known neurologic issues noted.   Relevant past medical, surgical, family and social history reviewed and updated as indicated. Interim medical history since our last visit reviewed. Allergies and medications reviewed and updated.  Review of Systems  Per HPI unless specifically indicated above     Objective:    BP 115/74   Pulse 69   Temp 99 F (37.2 C) (Oral)   Ht 5' 1"  (1.549 m)   Wt 124 lb (56.2 kg)   SpO2 98%   BMI 23.43 kg/m   Wt Readings from Last 3 Encounters:  04/17/19 124 lb (56.2 kg)  02/18/18 120 lb (54.4 kg)  05/20/16 119 lb (54 kg)    Physical Exam Vitals and nursing note reviewed.  Constitutional:      Appearance: Normal appearance. She is not ill-appearing.  HENT:     Head: Atraumatic.  Eyes:     Extraocular  Movements: Extraocular movements intact.     Conjunctiva/sclera: Conjunctivae normal.  Cardiovascular:     Rate and Rhythm: Normal rate and regular rhythm.     Heart sounds: Normal heart sounds.  Pulmonary:     Effort: Pulmonary effort is normal.     Breath sounds: Normal breath sounds.  Abdominal:     General: Bowel sounds are normal. There is no distension.     Palpations: Abdomen is soft.     Tenderness: There is no abdominal tenderness. There is no right CVA tenderness, left CVA tenderness or guarding.  Musculoskeletal:        General: Normal range of motion.     Cervical back: Normal range of motion and neck supple.  Skin:    General: Skin is warm and dry.  Neurological:     General: No focal deficit present.     Mental Status: She is alert and oriented to person, place, and time.     Cranial Nerves: No cranial nerve deficit.     Motor: No weakness.     Coordination: Coordination normal.     Gait: Gait normal.  Psychiatric:        Mood and Affect: Mood normal.  Thought Content: Thought content normal.        Judgment: Judgment normal.     Results for orders placed or performed in visit on 04/17/19  Microscopic Examination   URINE  Result Value Ref Range   WBC, UA 0-5 0 - 5 /hpf   RBC None seen 0 - 2 /hpf   Epithelial Cells (non renal) 0-10 0 - 10 /hpf   Mucus, UA Present Not Estab.   Bacteria, UA Few (A) None seen/Few  Urine Culture, Reflex   URINE  Result Value Ref Range   Urine Culture, Routine Final report    Organism ID, Bacteria Comment   UA/M w/rflx Culture, Routine   Specimen: Urine   URINE  Result Value Ref Range   Color, UA Red (A) Yellow   Appearance Ur Clear Clear   Microscopic Examination See below:    Urinalysis Reflex Comment       Assessment & Plan:   Problem List Items Addressed This Visit    None    Visit Diagnoses    Acute lower UTI    -  Primary   Tx with macrobid, await urine culture. Push fluids, AZO prn   Relevant  Medications   nitrofurantoin, macrocrystal-monohydrate, (MACROBID) 100 MG capsule   fluconazole (DIFLUCAN) 150 MG tablet   Other Relevant Orders   UA/M w/rflx Culture, Routine (Completed)   Dizziness       Options reviewed, including ER, Neurology referra, and MRI - pt wishing to hold off on workup for now and monitor for further sxs as sxs resolved spontaneously    Greater than 25 minutes spent today in direct care and counseling with patient. Discussed driving precautions given her unclear etiology of dizzy spell and strict return precautions if sxs recurring in any way   Follow up plan: Return if symptoms worsen or fail to improve.

## 2019-04-20 LAB — UA/M W/RFLX CULTURE, ROUTINE

## 2019-04-20 LAB — MICROSCOPIC EXAMINATION: RBC: NONE SEEN /hpf (ref 0–2)

## 2019-04-20 LAB — URINE CULTURE, REFLEX

## 2019-05-13 DIAGNOSIS — L700 Acne vulgaris: Secondary | ICD-10-CM | POA: Diagnosis not present

## 2019-05-13 DIAGNOSIS — Z79899 Other long term (current) drug therapy: Secondary | ICD-10-CM | POA: Diagnosis not present

## 2019-06-12 DIAGNOSIS — L700 Acne vulgaris: Secondary | ICD-10-CM | POA: Diagnosis not present

## 2019-06-12 DIAGNOSIS — Z79899 Other long term (current) drug therapy: Secondary | ICD-10-CM | POA: Diagnosis not present

## 2019-07-16 ENCOUNTER — Other Ambulatory Visit: Payer: Self-pay

## 2019-07-16 ENCOUNTER — Ambulatory Visit: Payer: BC Managed Care – PPO | Admitting: Dermatology

## 2019-07-16 DIAGNOSIS — L308 Other specified dermatitis: Secondary | ICD-10-CM

## 2019-07-16 DIAGNOSIS — L853 Xerosis cutis: Secondary | ICD-10-CM

## 2019-07-16 DIAGNOSIS — L700 Acne vulgaris: Secondary | ICD-10-CM

## 2019-07-16 DIAGNOSIS — L821 Other seborrheic keratosis: Secondary | ICD-10-CM

## 2019-07-16 DIAGNOSIS — K130 Diseases of lips: Secondary | ICD-10-CM | POA: Diagnosis not present

## 2019-07-16 DIAGNOSIS — Z872 Personal history of diseases of the skin and subcutaneous tissue: Secondary | ICD-10-CM

## 2019-07-16 LAB — POCT URINE PREGNANCY: Preg Test, Ur: NEGATIVE

## 2019-07-16 MED ORDER — EUCRISA 2 % EX OINT: 1.0000 "application " | TOPICAL_OINTMENT | Freq: Two times a day (BID) | CUTANEOUS | 1 refills | Status: DC

## 2019-07-16 MED ORDER — ISOTRETINOIN 30 MG PO CAPS: 30.0000 mg | ORAL_CAPSULE | Freq: Two times a day (BID) | ORAL | 0 refills | Status: DC

## 2019-07-16 NOTE — Patient Instructions (Addendum)
  Counseled do not get pregnant, do not share pills, do not donate blood. Isotretinoin (except for Absorica brand) must be taken with a fatty meal for best absorption.

## 2019-07-16 NOTE — Progress Notes (Addendum)
   Follow-Up Visit   Subjective  Kathryn Maynard is a 44 y.o. female who presents for the following: Acne (week# 36 - Isotretinoin 22m) and Follow-up (Ak follow up - nose. Seems healed.). Patient c/o dry skin and chapped lips - controlled on moisturizers and Eucrisa.   The following portions of the chart were reviewed this encounter and updated as appropriate:     Review of Systems: No other skin or systemic complaints.  Objective  Well appearing patient in no apparent distress; mood and affect are within normal limits.  A focused examination was performed including face, neck chest, arms. Relevant physical exam findings are noted in the Assessment and Plan.  Objective  face, chest, back: Pink macules of face. Pregnancy test negative  Objective  lips: Xerosis  Objective  Mid Tip of Nose: Clear today.  Objective  Neck - Anterior: Pink patches.  Objective  Chest: Stuck-on, waxy, tan-brown papule or plaque --Discussed benign etiology and prognosis.   Objective  generalized: Xerosis  Assessment & Plan  Acne vulgaris face, chest, back  Improving. Pt pleased with results. Plan to continue at least 1 more month on Isotretinoin.  Other Related Procedures POCT urine pregnancy - negative today  Reordered Medications ISOtretinoin (ACCUTANE) 30 MG capsule  Cheilitis lips  Continue lip balm several times daily.  History of actinic keratoses Mid Tip of Nose  Observe   Other eczema Neck - Anterior  Ordered Medications: Crisaborole (EUCRISA) 2 % OINT  Seborrheic keratosis Chest  Xerosis cutis generalized  Recommend moisturizer daily  Return in about 30 days (around 08/15/2019).   I, HAshok Cordia CMA, am acting as scribe for DSarina Ser MD .

## 2019-08-20 ENCOUNTER — Encounter: Payer: Self-pay | Admitting: Dermatology

## 2019-08-20 ENCOUNTER — Ambulatory Visit: Payer: BC Managed Care – PPO | Admitting: Dermatology

## 2019-08-20 ENCOUNTER — Other Ambulatory Visit: Payer: Self-pay

## 2019-08-20 DIAGNOSIS — L853 Xerosis cutis: Secondary | ICD-10-CM | POA: Diagnosis not present

## 2019-08-20 DIAGNOSIS — K130 Diseases of lips: Secondary | ICD-10-CM | POA: Diagnosis not present

## 2019-08-20 DIAGNOSIS — Z872 Personal history of diseases of the skin and subcutaneous tissue: Secondary | ICD-10-CM

## 2019-08-20 DIAGNOSIS — L700 Acne vulgaris: Secondary | ICD-10-CM

## 2019-08-20 NOTE — Progress Notes (Signed)
   Isotretinoin Follow-Up Visit   Subjective  Kathryn Maynard is a 44 y.o. female who presents for the following: Acne (Week #40 - Isotretinoin 83m. No mood changes, GI upet. Some muscle aches but they are the same.).  Week # 40    Side effects: Dry skin, dry lips  Denies changes in night vision, shortness of breath, abdominal pain, nausea, vomiting, diarrhea, blood in stool or urine, visual changes, headaches, epistaxis, joint pain, myalgias, mood changes, depression, or suicidal ideation.   Patient is not pregnant, not seeking pregnancy, and not breastfeeding.   The following portions of the chart were reviewed this encounter and updated as appropriate: medications, allergies, medical history  Review of Systems:  No other skin or systemic complaints except as noted in HPI or Assessment and Plan.  Objective  Well appearing patient in no apparent distress; mood and affect are within normal limits.  An examination of the face, neck, chest, and back was performed and relevant findings are noted below.   Objective  Face: Face is clear today.   Objective  Mid Tip of Nose: Clear today.   Assessment & Plan   Acne vulgaris Face  D/C Isotretinoin. Negative pregnancy test today . Lot # [id number] 01/22/2021  Will plan another pregnancy test in 30 days.  Other Related Medications ISOtretinoin (ACCUTANE) 30 MG capsule  History of actinic keratoses Mid Tip of Nose  Observe    While taking Isotretinoin and for 30 days after you finish the medication, do not get pregnant, do not share pills, do not donate blood. Isotretinoin is best absorbed when taken with a fatty meal. Isotretinoin can make you sensitive to the sun. Daily careful sun protection including sunscreen SPF 30+ when outdoors is recommended.  Xerosis - Continue emollients as directed  Cheilitis - Continue lip balm as directed, Dr. DLuvenia HellerCortibalm recommended  Follow-up in 30 days on Nurse schedule  for pregnancy test and in 6 months with Dr. KNehemiah Massedfor TBSE.  I, HAshok Cordia CMA, am acting as scribe for DSarina Ser MD .   Documentation: I have reviewed the above documentation for accuracy and completeness, and I agree with the above.  DSarina Ser MD

## 2019-09-18 ENCOUNTER — Other Ambulatory Visit: Payer: Self-pay

## 2019-09-18 ENCOUNTER — Ambulatory Visit: Payer: BC Managed Care – PPO

## 2019-09-18 ENCOUNTER — Telehealth: Payer: Self-pay

## 2019-09-18 NOTE — Telephone Encounter (Signed)
Patient came in today for 1 month post Accutane pregnancy test.   Pregnancy test was negative and results entered into [employer].

## 2020-01-23 ENCOUNTER — Encounter: Payer: BC Managed Care – PPO | Admitting: Dermatology

## 2020-03-24 DIAGNOSIS — R52 Pain, unspecified: Secondary | ICD-10-CM | POA: Diagnosis not present

## 2020-03-24 DIAGNOSIS — R059 Cough, unspecified: Secondary | ICD-10-CM | POA: Diagnosis not present

## 2020-03-24 DIAGNOSIS — R110 Nausea: Secondary | ICD-10-CM | POA: Diagnosis not present

## 2020-03-24 DIAGNOSIS — U071 COVID-19: Secondary | ICD-10-CM | POA: Diagnosis not present

## 2020-03-26 ENCOUNTER — Emergency Department
Admission: EM | Admit: 2020-03-26 | Discharge: 2020-03-26 | Discharge status: Against Medical Advice | Payer: BC Managed Care – PPO

## 2020-03-26 DIAGNOSIS — J189 Pneumonia, unspecified organism: Secondary | ICD-10-CM | POA: Diagnosis not present

## 2020-03-26 DIAGNOSIS — U071 COVID-19: Secondary | ICD-10-CM | POA: Diagnosis not present

## 2020-03-26 DIAGNOSIS — R112 Nausea with vomiting, unspecified: Secondary | ICD-10-CM | POA: Diagnosis not present

## 2020-03-26 DIAGNOSIS — Z7189 Other specified counseling: Secondary | ICD-10-CM | POA: Diagnosis not present

## 2020-03-26 DIAGNOSIS — E860 Dehydration: Secondary | ICD-10-CM | POA: Diagnosis not present

## 2020-03-26 NOTE — ED Notes (Signed)
Pt called to be triaged, no response by patient.

## 2020-03-26 NOTE — ED Notes (Signed)
Patient called to be triaged x2, no response by patient.

## 2020-03-26 NOTE — ED Notes (Signed)
Patient called to be triaged x3, no response by the patient.

## 2020-04-03 DIAGNOSIS — U071 COVID-19: Secondary | ICD-10-CM | POA: Diagnosis not present

## 2020-04-06 ENCOUNTER — Ambulatory Visit: Payer: BC Managed Care – PPO | Admitting: Dermatology

## 2020-05-27 DIAGNOSIS — Z1231 Encounter for screening mammogram for malignant neoplasm of breast: Secondary | ICD-10-CM | POA: Diagnosis not present

## 2020-05-27 DIAGNOSIS — Z01419 Encounter for gynecological examination (general) (routine) without abnormal findings: Secondary | ICD-10-CM | POA: Diagnosis not present

## 2020-05-27 DIAGNOSIS — Z6824 Body mass index (BMI) 24.0-24.9, adult: Secondary | ICD-10-CM | POA: Diagnosis not present

## 2020-07-02 DIAGNOSIS — H16203 Unspecified keratoconjunctivitis, bilateral: Secondary | ICD-10-CM | POA: Diagnosis not present

## 2020-07-10 DIAGNOSIS — H16223 Keratoconjunctivitis sicca, not specified as Sjogren's, bilateral: Secondary | ICD-10-CM | POA: Diagnosis not present

## 2020-07-23 DIAGNOSIS — H16223 Keratoconjunctivitis sicca, not specified as Sjogren's, bilateral: Secondary | ICD-10-CM | POA: Diagnosis not present

## 2020-10-07 DIAGNOSIS — G43009 Migraine without aura, not intractable, without status migrainosus: Secondary | ICD-10-CM | POA: Diagnosis not present

## 2020-10-07 DIAGNOSIS — Z1211 Encounter for screening for malignant neoplasm of colon: Secondary | ICD-10-CM | POA: Diagnosis not present

## 2020-10-07 DIAGNOSIS — Z13220 Encounter for screening for lipoid disorders: Secondary | ICD-10-CM | POA: Diagnosis not present

## 2020-10-07 DIAGNOSIS — Z1389 Encounter for screening for other disorder: Secondary | ICD-10-CM | POA: Diagnosis not present

## 2020-10-07 DIAGNOSIS — Z0001 Encounter for general adult medical examination with abnormal findings: Secondary | ICD-10-CM | POA: Diagnosis not present

## 2020-10-07 DIAGNOSIS — E538 Deficiency of other specified B group vitamins: Secondary | ICD-10-CM | POA: Diagnosis not present

## 2020-10-08 DIAGNOSIS — E538 Deficiency of other specified B group vitamins: Secondary | ICD-10-CM | POA: Diagnosis not present

## 2020-10-15 DIAGNOSIS — E538 Deficiency of other specified B group vitamins: Secondary | ICD-10-CM | POA: Diagnosis not present

## 2020-10-22 DIAGNOSIS — E538 Deficiency of other specified B group vitamins: Secondary | ICD-10-CM | POA: Diagnosis not present

## 2020-10-29 DIAGNOSIS — E538 Deficiency of other specified B group vitamins: Secondary | ICD-10-CM | POA: Diagnosis not present

## 2020-11-01 DIAGNOSIS — Z1211 Encounter for screening for malignant neoplasm of colon: Secondary | ICD-10-CM | POA: Diagnosis not present

## 2020-11-07 LAB — EXTERNAL GENERIC LAB PROCEDURE: COLOGUARD: NEGATIVE

## 2020-11-07 LAB — COLOGUARD: COLOGUARD: NEGATIVE

## 2021-01-07 DIAGNOSIS — E538 Deficiency of other specified B group vitamins: Secondary | ICD-10-CM | POA: Diagnosis not present

## 2021-06-01 ENCOUNTER — Ambulatory Visit
Admission: RE | Admit: 2021-06-01 | Discharge: 2021-06-01 | Disposition: A | Discharge status: Home / Self Care | Payer: BC Managed Care – PPO | Source: Ambulatory Visit | Attending: Physician Assistant | Admitting: Physician Assistant

## 2021-06-01 ENCOUNTER — Other Ambulatory Visit: Payer: Self-pay | Admitting: Physician Assistant

## 2021-06-01 ENCOUNTER — Other Ambulatory Visit: Payer: Self-pay

## 2021-06-01 DIAGNOSIS — R519 Headache, unspecified: Secondary | ICD-10-CM | POA: Insufficient documentation

## 2021-06-01 MED ORDER — GADOBUTROL 1 MMOL/ML IV SOLN
5.0000 mL | Freq: Once | INTRAVENOUS | Status: AC | PRN
  Administered 2021-06-01: 5 mL via INTRAVENOUS

## 2022-02-23 ENCOUNTER — Encounter: Payer: Self-pay | Admitting: Radiology

## 2022-02-23 NOTE — Telephone Encounter (Signed)
This encounter was created in error - please disregard.

## 2022-06-05 IMAGING — MR MR HEAD WO/W CM
15 of 17 series · 40 of 48 positions shown · IV contrast (gadavist)
Comparison: Brain MRI 06/03/2014.

CLINICAL DATA: Acute intractable headache, unspecified headache
type. Additional history provided by scanning technologist: Patient
reports 31 years of migraines.

EXAM:
MRI HEAD WITHOUT AND WITH CONTRAST
TECHNIQUE: Multiplanar, multiecho pulse sequences of the brain and surrounding
structures were obtained without and with intravenous contrast.
CONTRAST:  5mL GADAVIST GADOBUTROL 1 MMOL/ML IV SOLN

[Series 5: ax dwi_tracew · axial · 3.0mm · 0.65mm/px · z∈[-117,+32]mm · 3 of 48 slices shown]
[im 1/48]
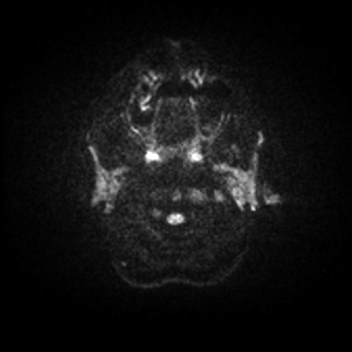
[im 24/48]
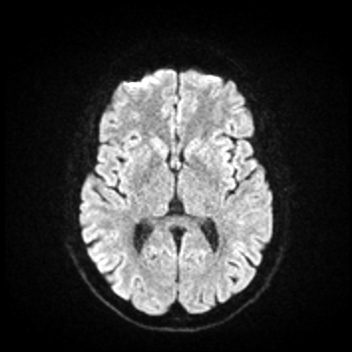
[im 48/48]
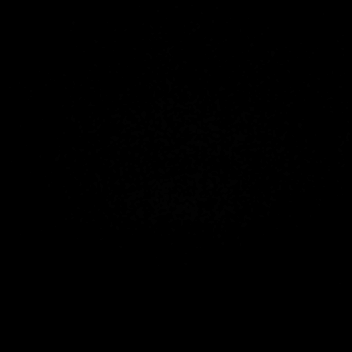

[Series 6: ax dwi_adc · axial · 3.0mm · 0.65mm/px · z∈[-117,+28]mm · 2 of 47 slices shown]
[im 1/47]
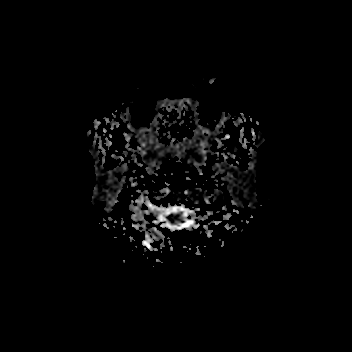
[im 47/47]
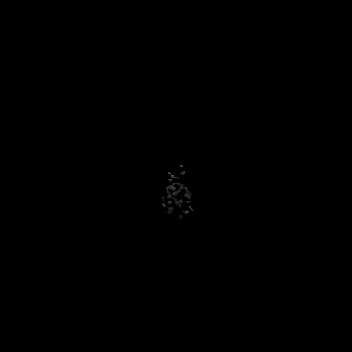

[Series 7: cor dwi_tracew · coronal · 5.0mm · 0.65mm/px · 2 of 40 slices shown]
[im 1/40]
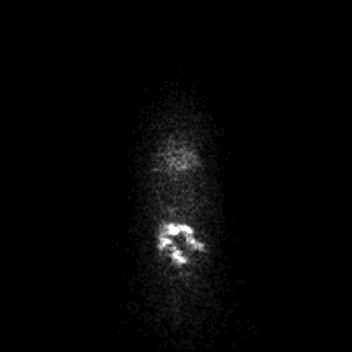
[im 40/40]
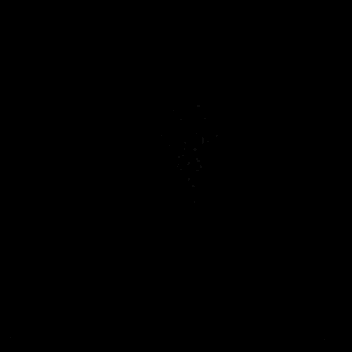

[Series 8: cor dwi_adc · coronal · 5.0mm · 0.65mm/px · 2 of 38 slices shown]
[im 1/38]
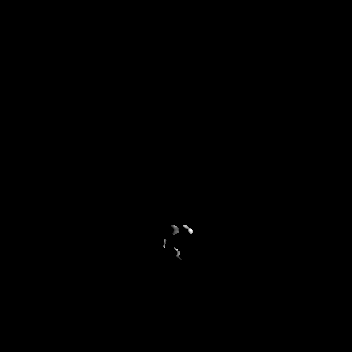
[im 38/38]
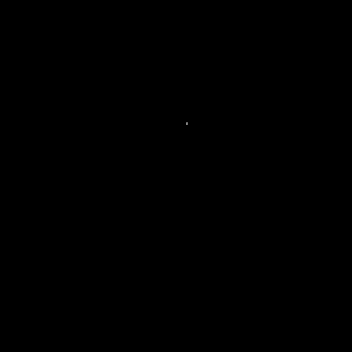

[Series 9: T1 · sagittal · 5.0mm · 0.62mm/px · 1 of 25 slices shown (1 of 2)]
[im 1/25]
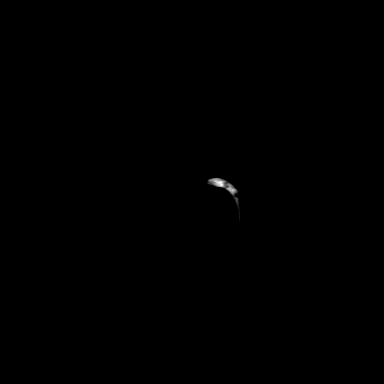

[Series 10: T2 · axial · 5.0mm · 0.53mm/px · 1 of 26 slices shown]
[im 1/26]
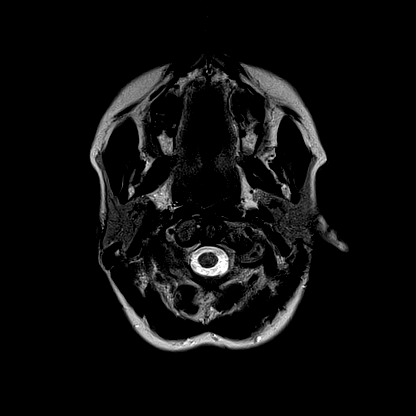

[Series 12: pha_images · axial · 3.0mm · 0.90mm/px · z∈[-127,+31]mm · 3 of 55 slices shown]
[im 1/55]
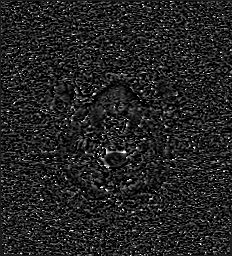
[im 28/55]
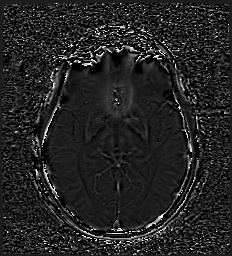
[im 55/55]
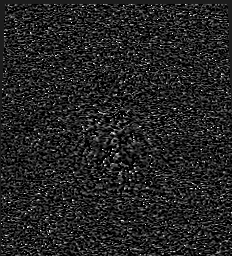

[Series 13: swi_images · axial · 3.0mm · 0.90mm/px · z∈[-127,+42]mm · 3 of 60 slices shown]
[im 1/60]
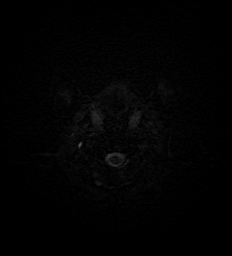
[im 30/60]
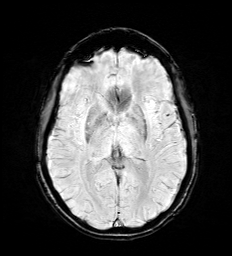
[im 60/60]
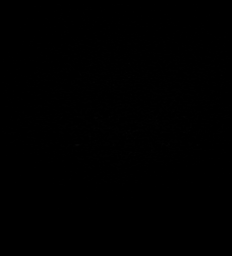

[Series 15: FLAIR · axial · 3.0mm · 0.53mm/px · z∈[-119,+36]mm · 3 of 55 slices shown]
[im 1/55]
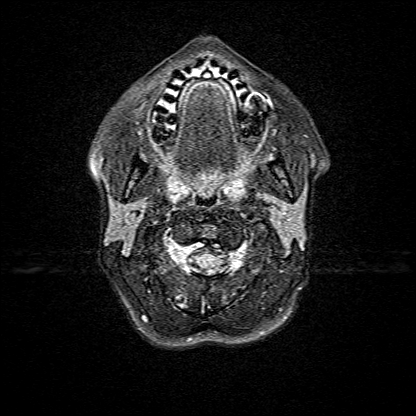
[im 28/55]
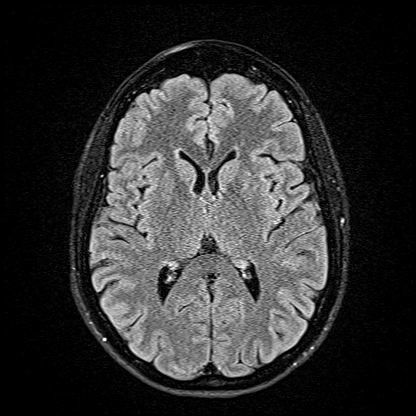
[im 55/55]
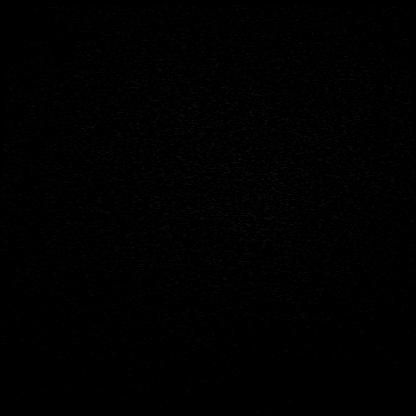

[Series 16: T1 · axial · 1.0mm · 0.98mm/px · z∈[-130,+38]mm · 8 of 176 slices shown (2 of 2)]
[im 1/176]
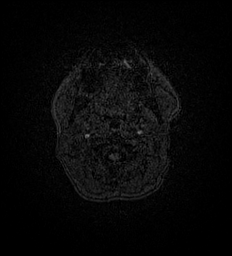
[im 26/176]
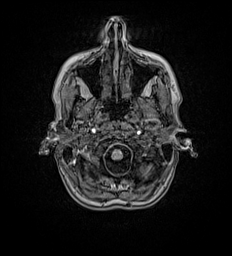
[im 51/176]
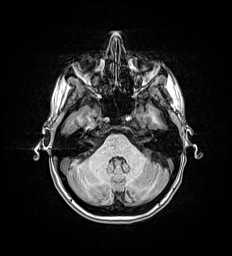
[im 76/176]
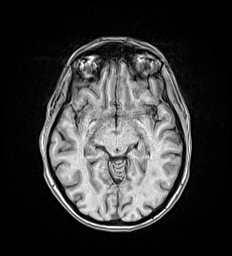
[im 101/176]
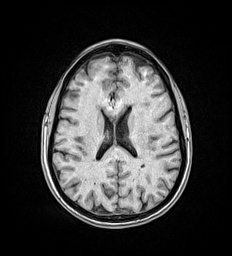
[im 126/176]
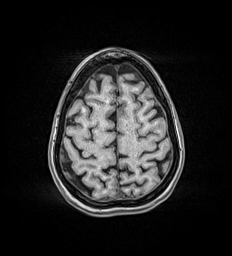
[im 151/176]
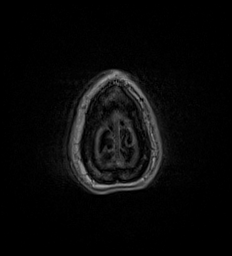
[im 176/176]
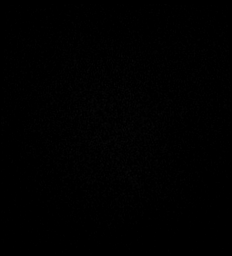

[Series 17: T2 post-contrast · coronal · 5.0mm · 0.57mm/px · 1 of 29 slices shown]
[im 1/29]
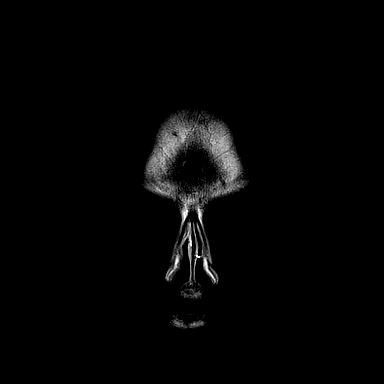

[Series 18: T1 post-contrast · axial · 1.0mm · 0.98mm/px · z∈[-130,+37]mm · 8 of 175 slices shown (1 of 3)]
[im 1/175]
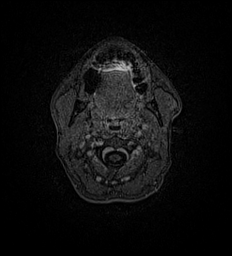
[im 25/175]
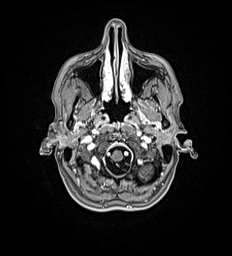
[im 50/175]
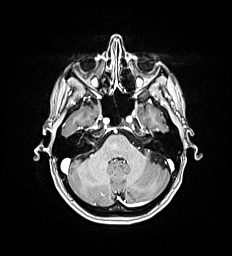
[im 75/175]
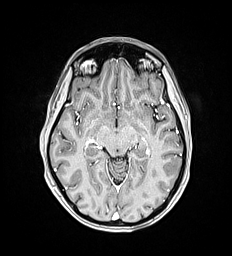
[im 100/175]
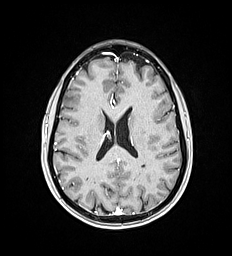
[im 125/175]
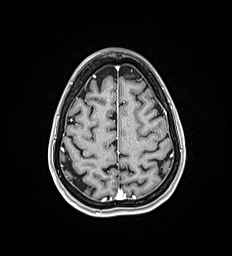
[im 150/175]
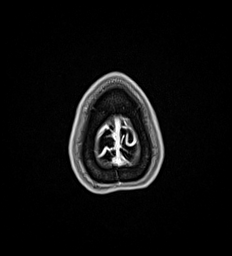
[im 175/175]
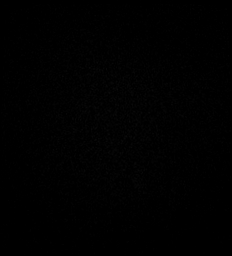

[Series 19: T1 post-contrast · coronal · 5.0mm · 0.57mm/px · 1 of 29 slices shown (2 of 3)]
[im 1/29]
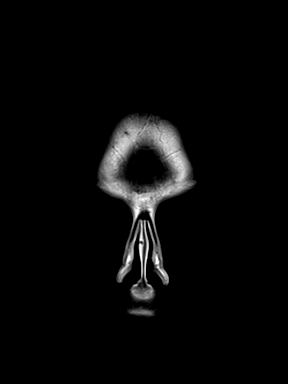

[Series 20: T1 post-contrast · sagittal · 5.0mm · 0.62mm/px · 1 of 25 slices shown (3 of 3)]
[im 1/25]
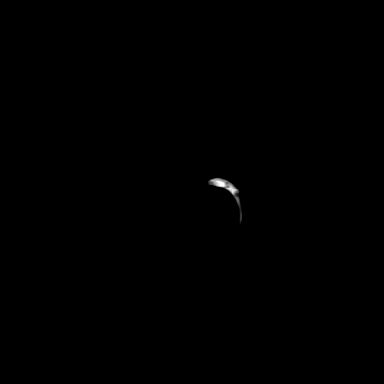

[Series 1001: mpr 1mm range · coronal · 1.0mm · 0.24mm/px · 1 of 74 slices shown]
[im 1/74]
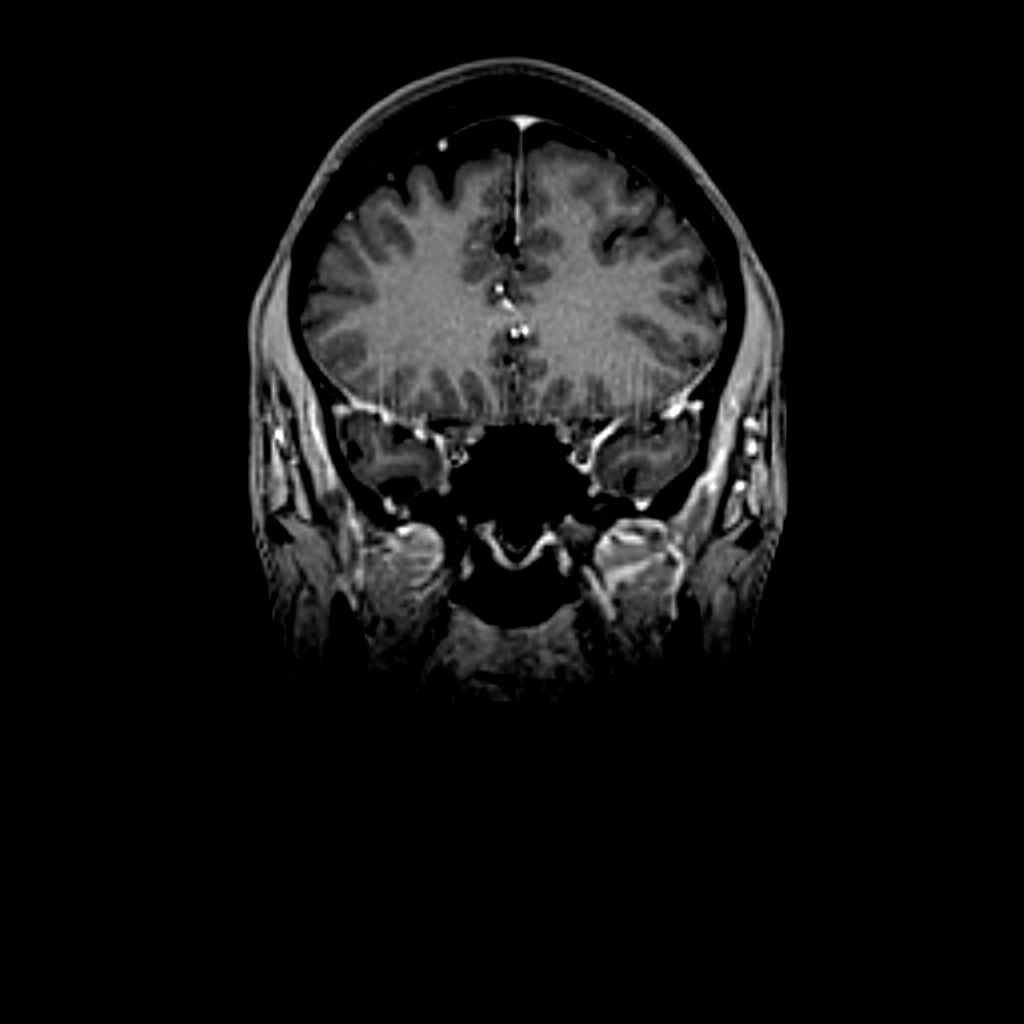

[40 of 48 positions shown; findings below may reference images not displayed]

FINDINGS: Brain:

Cerebral volume is normal.

Similar to the prior brain MRI of 06/03/2014, there is a
subcentimeter focus of ill-defined enhancement within the right
pons. There is corresponding susceptibility-weighted signal loss at
this site, and this likely reflects a small focus of capillary
telangiectasia.

No cortical encephalomalacia is identified. No significant cerebral
white matter disease for age.

There is no acute infarct.

No evidence of an intracranial mass.

No extra-axial fluid collection.

No midline shift.

Vascular: Maintained flow voids within the proximal large arterial
vessels.

Skull and upper cervical spine: No focal suspicious marrow lesion.

Sinuses/Orbits: Visualized orbits show no acute finding. Trace
mucosal thickening within the bilateral ethmoid sinuses. Small
mucous retention cyst, and background trace mucosal thickening,
within the left maxillary sinus.
IMPRESSION: No evidence of acute intracranial abnormality.

Redemonstrated subcentimeter probable focus of capillary
telangiectasia within the right pons.

Otherwise unremarkable MRI appearance of the brain.

Mild paranasal sinus disease, as described.

## 2023-06-14 ENCOUNTER — Other Ambulatory Visit: Payer: Self-pay | Admitting: Family

## 2023-06-14 DIAGNOSIS — Z8249 Family history of ischemic heart disease and other diseases of the circulatory system: Secondary | ICD-10-CM

## 2023-06-14 DIAGNOSIS — N63 Unspecified lump in unspecified breast: Secondary | ICD-10-CM

## 2023-06-20 ENCOUNTER — Ambulatory Visit
Admission: RE | Admit: 2023-06-20 | Discharge: 2023-06-20 | Disposition: A | Discharge status: Home / Self Care | Payer: No Typology Code available for payment source | Source: Ambulatory Visit | Attending: Family | Admitting: Family

## 2023-06-20 DIAGNOSIS — Z8249 Family history of ischemic heart disease and other diseases of the circulatory system: Secondary | ICD-10-CM

## 2023-06-21 NOTE — Progress Notes (Unsigned)
 Cardiology Office Note:   Date:  06/22/2023  ID:  Kathryn Maynard, DOB [birth year], MRN [id number] PCP:  Gracelyn Nurse, MD  [facility] Providers Cardiologist:  Alverda Skeans, MD Referring MD: Gracelyn Nurse, MD  Chief Complaint/Reason for Referral: Family history of coronary artery disease, dyspnea ASSESSMENT:    1. Shortness of breath   2. Family history of coronary artery disease   3. Palpitations     PLAN:   In order of problems listed above: Dyspnea:  We will obtain a coronary CTA and echocardiogram to evaluate further.  If the patient has mild obstructive coronary artery disease, they will require a statin (with goal LDL < 70) and aspirin, if they have high-grade disease we will need to consider optimal medical therapy and if symptoms are refractory to medical therapy, then a cardiac catheterization with possible PCI will be pursued to alleviate symptoms.  If they have high risk disease we will proceed directly to cardiac catheterization.   Palpitations:  Check reflex TSH, echo and monitor.        Dispo:  Return if symptoms worsen or fail to improve.      Medication Adjustments/Labs and Tests Ordered: Current medicines are reviewed at length with the patient today.  Concerns regarding medicines are outlined above.  The following changes have been made:  no change   Labs/tests ordered: Orders Placed This Encounter  Procedures   CT CORONARY MORPH W/CTA COR W/SCORE W/CA W/CM &/OR WO/CM   Basic metabolic panel   TSH Rfx on Abnormal to Free T4   LONG TERM MONITOR (3-14 DAYS)   EKG 12-Lead   ECHOCARDIOGRAM COMPLETE    Medication Changes: Meds ordered this encounter  Medications   metoprolol tartrate (LOPRESSOR) 50 MG tablet    Sig: Take 1 tablet (50 mg total) by mouth once for 1 dose. Take 90-120 minutes prior to scan. Hold for SBP less than 110.    Dispense:  1 tablet    Refill:  0    Current medicines are reviewed at length with the patient today.  The  patient does not have concerns regarding medicines.   History of Present Illness:      FOCUSED PROBLEM LIST:   Family history of coronary artery disease CKD stage II Cystic acne On spironolactone Vapes  February 2025:  Patient consents to use of AI scribe.  The patient is a 48 year old female with the above listed medical history referred for recommendations regarding her family history of coronary artery disease and dyspnea.  She experiences a sensation of fullness and tightness in her neck, described as feeling her heartbeat in her neck. This occurs without specific triggers and can happen at rest. During these episodes, the vein in her neck becomes visibly enlarged. She is concerned due to her family history of heart disease, as her mother had similar symptoms before a heart attack. She also reports occasional palpitations, particularly when active.  She experiences shortness of breath that varies with activity level. She becomes short of breath when climbing stairs or engaging in strenuous activities, but not during normal walking or at rest. She sometimes attributes this to anxiety. No chest pain, lightheadedness, blacking out spells, or difficulty breathing while lying flat. Her feet turn purple for no apparent reason, a condition she has experienced for years without pain.  Her past medical history includes a recent episode of high cholesterol, attributed to dietary changes involving increased consumption of boiled eggs. She has since adjusted her diet. She  is currently taking spironolactone for acne, not for blood pressure management.  She works at [employer] and is married with four children, one of whom still lives at home. She smokes, although she tries to keep it hidden and acknowledges the need to quit. She engages in some physical activity, such as using a mobile bike during lunch breaks and walking her long driveway, but does not exercise regularly.         Current  Medications: Current Meds  Medication Sig   metoprolol tartrate (LOPRESSOR) 50 MG tablet Take 1 tablet (50 mg total) by mouth once for 1 dose. Take 90-120 minutes prior to scan. Hold for SBP less than 110.   norethindrone-ethinyl estradiol (MICROGESTIN,JUNEL,LOESTRIN) 1-20 MG-MCG tablet Take by mouth daily.   spironolactone (ALDACTONE) 100 MG tablet Take 1 tablet by mouth daily.   SUMAtriptan (IMITREX) 100 MG tablet Take by mouth as needed.      Review of Systems:   Please see the history of present illness.    All other systems reviewed and are negative.     EKGs/Labs/Other Test Reviewed:   EKG:    EKG Interpretation Date/Time:  Thursday June 22 2023 10:52:17 EST Ventricular Rate:  86 PR Interval:  110 QRS Duration:  74 QT Interval:  368 QTC Calculation: 440 R Axis:   65  Text Interpretation: Sinus rhythm with short PR Right atrial enlargement Nonspecific ST abnormality No previous ECGs available Confirmed by Alverda Skeans ([id number]) on 06/22/2023 11:04:12 AM         Risk Assessment/Calculations:          Physical Exam:   VS:  BP 108/82   Pulse 66   Ht 5\' 1"  (1.549 m)   Wt 115 lb (52.2 kg)   SpO2 98%   BMI 21.73 kg/m        Wt Readings from Last 3 Encounters:  06/22/23 115 lb (52.2 kg)  04/17/19 124 lb (56.2 kg)  02/18/18 120 lb (54.4 kg)      GENERAL:  No apparent distress, AOx3 HEENT:  No carotid bruits, +2 carotid impulses, no scleral icterus CAR: RRR no murmurs, gallops, rubs, or thrills RES:  Clear to auscultation bilaterally ABD:  Soft, nontender, nondistended, positive bowel sounds x 4 VASC:  +2 radial pulses, +2 carotid pulses NEURO:  CN 2-12 grossly intact; motor and sensory grossly intact PSYCH:  No active depression or anxiety EXT:  No edema, ecchymosis, or cyanosis  Signed, Orbie Pyo, MD  06/22/2023 11:36 AM    [facility] [address] [place], [place], [place]  [address] Phone: [phone]; Fax: [phone]    Note:  This document was prepared using Dragon voice recognition software and may include unintentional dictation errors.

## 2023-06-22 ENCOUNTER — Ambulatory Visit (INDEPENDENT_AMBULATORY_CARE_PROVIDER_SITE_OTHER): Payer: BC Managed Care – PPO

## 2023-06-22 ENCOUNTER — Ambulatory Visit: Payer: Self-pay | Attending: Internal Medicine | Admitting: Internal Medicine

## 2023-06-22 ENCOUNTER — Encounter: Payer: Self-pay | Admitting: Internal Medicine

## 2023-06-22 VITALS — BP 108/82 | HR 66 | Ht 61.0 in | Wt 115.0 lb

## 2023-06-22 DIAGNOSIS — R002 Palpitations: Secondary | ICD-10-CM | POA: Diagnosis not present

## 2023-06-22 DIAGNOSIS — R0602 Shortness of breath: Secondary | ICD-10-CM

## 2023-06-22 DIAGNOSIS — I10 Essential (primary) hypertension: Secondary | ICD-10-CM

## 2023-06-22 DIAGNOSIS — Z8249 Family history of ischemic heart disease and other diseases of the circulatory system: Secondary | ICD-10-CM

## 2023-06-22 MED ORDER — METOPROLOL TARTRATE 50 MG PO TABS: 50.0000 mg | ORAL_TABLET | Freq: Once | ORAL | 0 refills | Status: AC

## 2023-06-22 NOTE — Progress Notes (Unsigned)
 ZIO serial # [id number] from office inventory applied to patient.

## 2023-06-22 NOTE — Patient Instructions (Addendum)
 Medication Instructions:  Your physician recommends that you continue on your current medications as directed. Please refer to the Current Medication list given to you today.  *If you need a refill on your cardiac medications before your next appointment, please call your pharmacy*  Lab Work: TODAY: BMP, reflex TSH If you have labs (blood work) drawn today and your tests are completely normal, you will receive your results only by: MyChart Message (if you have MyChart) OR A paper copy in the mail If you have any lab test that is abnormal or we need to change your treatment, we will call you to review the results.  Testing/Procedures: Your physician has requested that you have an echocardiogram. Echocardiography is a painless test that uses sound waves to create images of your heart. It provides your doctor with information about the size and shape of your heart and how well your heart's chambers and valves are working. This procedure takes approximately one hour. There are no restrictions for this procedure. Please do NOT wear cologne, perfume, aftershave, or lotions (deodorant is allowed). Please arrive 15 minutes prior to your appointment time.  Please note: We ask at that you not bring children with you during ultrasound (echo/ vascular) testing. Due to room size and safety concerns, children are not allowed in the ultrasound rooms during exams. Our front office staff cannot provide observation of children in our lobby area while testing is being conducted. An adult accompanying a patient to their appointment will only be allowed in the ultrasound room at the discretion of the ultrasound technician under special circumstances. We apologize for any inconvenience.  Your physician has requested that you have cardiac CT. Cardiac computed tomography (CT) is a painless test that uses an x-ray machine to take clear, detailed pictures of your heart. For further information please visit  [email or web address] Please follow instruction sheet as given.  Your physician has requested that you wear a Zio heart monitor for 3 days. Please allow 2 weeks after returning the heart monitor before our office calls you with the results.   Follow-Up: At [facility], you and your health needs are our priority.  As part of our continuing mission to provide you with exceptional heart care, we have created designated Provider Care Teams.  These Care Teams include your primary Cardiologist (physician) and Advanced Practice Providers (APPs -  Physician Assistants and Nurse Practitioners) who all work together to provide you with the care you need, when you need it.  Your next appointment:   As needed  The format for your next appointment:   In Person  Provider:   Orbie Pyo, MD {  Other Instructions   Your cardiac CT will be scheduled at:   [facility] [address] [place], [place] [address] [phone]  Please arrive at the [facility] (Entrance C2) of [facility] 30 minutes prior to test start time.  You can use the FREE valet parking offered at entrance C (encouraged to control the heart rate for the test).  Proceed to the [facility] Radiology Department (first floor) to check-in and test prep.  All radiology patients and guests should use entrance C2 at [facility], accessed from [facility], even though the hospital's physical address listed is [address]    Please follow these instructions carefully (unless otherwise directed):  An IV will be required for this test and Nitroglycerin will be given.  Hold all erectile dysfunction medications at  least 3 days (72 hrs) prior to test. (Ie viagra, cialis, sildenafil, tadalafil, etc)   On the Night Before the Test: Be sure to Drink plenty of water. Do not consume any caffeinated/decaffeinated beverages or chocolate 12 hours prior to your  test. Do not take any antihistamines 12 hours prior to your test.  On the Day of the Test: Drink plenty of water until 1 hour prior to the test. Do not eat any food 1 hour prior to test. You may take your regular medications prior to the test.  Take metoprolol (Lopressor) 50 mg two hours prior to test. If you take Furosemide/Hydrochlorothiazide/Spironolactone/Chlorthalidone, please HOLD on the morning of the test. Patients who wear a continuous glucose monitor MUST remove the device prior to scanning. FEMALES- please wear underwire-free bra if available, avoid dresses & tight clothing      After the Test: Drink plenty of water. After receiving IV contrast, you may experience a mild flushed feeling. This is normal. On occasion, you may experience a mild rash up to 24 hours after the test. This is not dangerous. If this occurs, you can take Benadryl 25 mg, Zyrtec, Claritin, or Allegra and increase your fluid intake. (Patients taking Tikosyn should avoid Benadryl, and may take Zyrtec, Claritin, or Allegra) If you experience trouble breathing, this can be serious. If it is severe call [phone] IMMEDIATELY. If it is mild, please call our office.  We will call to schedule your test 2-4 weeks out understanding that some insurance companies will need an authorization prior to the service being performed.   For more information and frequently asked questions, please visit our website : [email or web address]  For non-scheduling related questions, please contact the cardiac imaging nurse navigator should you have any questions/concerns: Cardiac Imaging Nurse Navigators Direct Office Dial: [phone]   For scheduling needs, including cancellations and rescheduling, please call [place], [phone].        1st Floor: - Lobby - Registration  - Pharmacy  - Lab - Cafe  2nd Floor: - PV Lab - Diagnostic Testing (echo, CT, nuclear med)  3rd Floor: - Vacant  4th Floor: - TCTS  (cardiothoracic surgery) - AFib Clinic - Structural Heart Clinic - Vascular Surgery  - Vascular Ultrasound  5th Floor: - HeartCare Cardiology (general and EP) - Clinical Pharmacy for coumadin, hypertension, lipid, weight-loss medications, and med management appointments    Valet parking services will be available as well.

## 2023-06-23 ENCOUNTER — Encounter: Payer: Self-pay | Admitting: Internal Medicine

## 2023-06-23 LAB — BASIC METABOLIC PANEL
BUN/Creatinine Ratio: 11 (ref 9–23)
BUN: 13 mg/dL (ref 6–24)
CO2: 20 mmol/L (ref 20–29)
Calcium: 9.7 mg/dL (ref 8.7–10.2)
Chloride: 102 mmol/L (ref 96–106)
Creatinine, Ser: 1.22 mg/dL — ABNORMAL HIGH (ref 0.57–1.00)
Glucose: 78 mg/dL (ref 70–99)
Potassium: 4.5 mmol/L (ref 3.5–5.2)
Sodium: 137 mmol/L (ref 134–144)
eGFR: 55 mL/min/{1.73_m2} — ABNORMAL LOW (ref >59)

## 2023-06-23 LAB — TSH RFX ON ABNORMAL TO FREE T4: TSH: 2.13 u[IU]/mL (ref 0.450–4.500)

## 2023-06-26 ENCOUNTER — Ambulatory Visit
Admission: RE | Admit: 2023-06-26 | Discharge: 2023-06-26 | Disposition: A | Discharge status: Home / Self Care | Payer: BC Managed Care – PPO | Source: Ambulatory Visit | Attending: Family | Admitting: Family

## 2023-06-26 ENCOUNTER — Ambulatory Visit
Admission: RE | Admit: 2023-06-26 | Discharge: 2023-06-26 | Disposition: A | Discharge status: Home / Self Care | Source: Ambulatory Visit | Attending: Family | Admitting: Family

## 2023-06-26 ENCOUNTER — Other Ambulatory Visit: Payer: Self-pay | Admitting: Family

## 2023-06-26 DIAGNOSIS — N6310 Unspecified lump in the right breast, unspecified quadrant: Secondary | ICD-10-CM

## 2023-06-26 DIAGNOSIS — N63 Unspecified lump in unspecified breast: Secondary | ICD-10-CM

## 2023-06-26 HISTORY — PX: BREAST BIOPSY: SHX20

## 2023-06-27 LAB — SURGICAL PATHOLOGY

## 2023-07-04 ENCOUNTER — Encounter: Payer: Self-pay | Admitting: Internal Medicine

## 2023-07-06 ENCOUNTER — Telehealth (HOSPITAL_COMMUNITY): Payer: Self-pay | Admitting: *Deleted

## 2023-07-06 NOTE — Telephone Encounter (Signed)
 Attempted to call patient regarding upcoming cardiac CT appointment. Left message on voicemail with name and callback number  Larey Brick RN Navigator Cardiac Imaging [facility] [phone] Office [phone] Cell

## 2023-07-06 NOTE — Telephone Encounter (Signed)
 Patient returning call about her upcoming cardiac imaging study; pt verbalizes understanding of appt date/time, parking situation and where to check in, pre-test NPO status and medications ordered, and verified current allergies; name and call back number provided for further questions should they arise  Larey Brick RN Navigator Cardiac Imaging [facility] [phone] office [phone] cell  Patient to take 50mg  metoprolol tartrate if her HR is greater than 70 BPM. She is aware to arrive at 1:30 PM.

## 2023-07-07 ENCOUNTER — Ambulatory Visit (HOSPITAL_COMMUNITY)
Admission: RE | Admit: 2023-07-07 | Discharge: 2023-07-07 | Disposition: A | Discharge status: Home / Self Care | Payer: BC Managed Care – PPO | Source: Ambulatory Visit | Attending: Internal Medicine | Admitting: Internal Medicine

## 2023-07-07 DIAGNOSIS — Z8249 Family history of ischemic heart disease and other diseases of the circulatory system: Secondary | ICD-10-CM | POA: Diagnosis present

## 2023-07-07 DIAGNOSIS — R0602 Shortness of breath: Secondary | ICD-10-CM | POA: Diagnosis present

## 2023-07-07 MED ORDER — METOPROLOL TARTRATE 5 MG/5ML IV SOLN: 10.0000 mg | Freq: Once | INTRAVENOUS | Status: DC | PRN

## 2023-07-07 MED ORDER — IOHEXOL 350 MG/ML SOLN
95.0000 mL | Freq: Once | INTRAVENOUS | Status: AC | PRN
  Administered 2023-07-07: 95 mL via INTRAVENOUS

## 2023-07-07 MED ORDER — NITROGLYCERIN 0.4 MG SL SUBL
0.8000 mg | SUBLINGUAL_TABLET | Freq: Once | SUBLINGUAL | Status: AC
  Administered 2023-07-07: 0.8 mg via SUBLINGUAL

## 2023-07-07 MED ORDER — NITROGLYCERIN 0.4 MG SL SUBL
SUBLINGUAL_TABLET | SUBLINGUAL | Status: AC
  Filled 2023-07-07: qty 1

## 2023-07-07 MED ORDER — DILTIAZEM HCL 25 MG/5ML IV SOLN: 10.0000 mg | INTRAVENOUS | Status: DC | PRN

## 2023-07-08 ENCOUNTER — Encounter: Payer: Self-pay | Admitting: Internal Medicine

## 2023-07-14 ENCOUNTER — Encounter: Payer: Self-pay | Admitting: Internal Medicine

## 2023-07-14 ENCOUNTER — Ambulatory Visit (HOSPITAL_COMMUNITY): Payer: BC Managed Care – PPO | Attending: Cardiology

## 2023-07-14 DIAGNOSIS — R0602 Shortness of breath: Secondary | ICD-10-CM | POA: Insufficient documentation

## 2023-07-14 LAB — ECHOCARDIOGRAM COMPLETE
Area-P 1/2: 2.95 cm2
MV M vel: 5 m/s
MV Peak grad: 100 mmHg
S' Lateral: 2.9 cm

## 2023-11-14 LAB — COLOGUARD: COLOGUARD: NEGATIVE

## 2024-04-04 ENCOUNTER — Other Ambulatory Visit: Payer: Self-pay | Admitting: Family

## 2024-04-04 ENCOUNTER — Encounter: Payer: Self-pay | Admitting: Family

## 2024-04-04 DIAGNOSIS — Z1231 Encounter for screening mammogram for malignant neoplasm of breast: Secondary | ICD-10-CM

## 2024-04-04 DIAGNOSIS — N6011 Diffuse cystic mastopathy of right breast: Secondary | ICD-10-CM

## 2024-05-02 ENCOUNTER — Ambulatory Visit
Admission: RE | Admit: 2024-05-02 | Discharge: 2024-05-02 | Disposition: A | Discharge status: Home / Self Care | Source: Ambulatory Visit | Attending: Family | Admitting: Family

## 2024-05-02 ENCOUNTER — Other Ambulatory Visit: Payer: Self-pay | Admitting: Family

## 2024-05-02 DIAGNOSIS — N6011 Diffuse cystic mastopathy of right breast: Secondary | ICD-10-CM

## 2024-05-06 ENCOUNTER — Inpatient Hospital Stay: Admission: RE | Admit: 2024-05-06 | Discharge: 2024-05-06 | Attending: Family | Admitting: Family

## 2024-05-06 ENCOUNTER — Ambulatory Visit
Admission: RE | Admit: 2024-05-06 | Discharge: 2024-05-06 | Disposition: A | Source: Ambulatory Visit | Attending: Family | Admitting: Family

## 2024-05-06 DIAGNOSIS — N6011 Diffuse cystic mastopathy of right breast: Secondary | ICD-10-CM

## 2024-05-06 DIAGNOSIS — N6311 Unspecified lump in the right breast, upper outer quadrant: Secondary | ICD-10-CM

## 2024-05-06 HISTORY — PX: BREAST BIOPSY: SHX20

## 2024-05-08 LAB — SURGICAL PATHOLOGY

## 2024-05-15 ENCOUNTER — Ambulatory Visit: Payer: Self-pay | Admitting: Surgery

## 2024-05-15 ENCOUNTER — Other Ambulatory Visit: Payer: Self-pay | Admitting: Surgery

## 2024-05-15 DIAGNOSIS — C50911 Malignant neoplasm of unspecified site of right female breast: Secondary | ICD-10-CM

## 2024-05-15 NOTE — Progress Notes (Signed)
 "   REFERRING PHYSICIAN:  Dzingle, Darice HERO, NP PROVIDER:  DEBBY CURTISTINE SHIPPER, MD MRN: [id number] DOB: 1975/06/28 DATE OF ENCOUNTER: 05/15/2024 Subjective    Chief Complaint: No chief complaint on file.   History of Present Illness: Kathryn Maynard is a 49 y.o. female who is seen today as an office consultation for evaluation of No chief complaint on file.  Patient seen today for 6 mm mass detected on her mammogram.  She had history of a right breast mass last year.  And with core biopsy but she had significant bleeding to the procedure and was found to be benign.  She returns for 76-month follow-up and the area appeared larger.  She had to postpone follow-up by about 3 months.  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  She has bilateral subpectoral silicone implants at least 50 years old.  No family history of breast cancer or ovarian cancer.     Review of Systems: A complete review of systems was obtained from the patient.  I have reviewed this information and discussed as appropriate with the patient.  See HPI as well for other ROS.     Medical History: Past Medical History:  Diagnosis Date   Migraine headache     Patient Active Problem List  Diagnosis   Migraine without aura and without status migrainosus, not intractable    Past Surgical History:  Procedure Laterality Date   OTHER SURGICAL HISTORY     removed endometriosis     Allergies  Allergen Reactions   Penicillin Unknown    Current Outpatient Medications on File Prior to Visit  Medication Sig Dispense Refill   cyanocobalamin  (VITAMIN B12) 1000 MCG tablet Take 1,000 mcg by mouth once daily     JUNEL 1/20, 21, 1-20 mg-mcg tablet Take 1 tablet by mouth once daily.     meloxicam (MOBIC) 15 MG tablet TAKE 1 TABLET BY MOUTH EVERY DAY 30 tablet 1   spironolactone (ALDACTONE) 100 MG tablet Take 100 mg by mouth once  daily     SUMAtriptan (IMITREX) 100 MG tablet Take 100 mg by mouth once as needed for Migraine. May take a second dose after 2 hours if needed.     No current facility-administered medications on file prior to visit.    Family History  Problem Relation Age of Onset   Myocardial Infarction (Heart attack) Mother    High blood pressure (Hypertension) Father    Diabetes Father    High blood pressure (Hypertension) Sister    Diabetes Sister      Social History   Tobacco Use  Smoking Status Never  Smokeless Tobacco Never     Social History   Socioeconomic History   Marital status: Married  Tobacco Use   Smoking status: Never   Smokeless tobacco: Never  Substance and Sexual Activity   Alcohol use: Never   Drug use: No   Sexual activity: Defer  Social History Narrative   Education: Automotive Engineer   Occupation: Advertising account planner   Hobbies: not listed   Marital Status: married   Social Drivers of Corporate Investment Banker Strain: Low Risk  (10/19/2023)   Overall Financial Resource Strain (CARDIA)    Difficulty of Paying Living Expenses: Not hard at all  Food Insecurity: No Food Insecurity (10/19/2023)   Hunger Vital Sign    Worried About Running Out of Food in the Last Year: Never true  Ran Out of Food in the Last Year: Never true  Transportation Needs: No Transportation Needs (10/19/2023)   PRAPARE - Administrator, Civil Service (Medical): No    Lack of Transportation (Non-Medical): No  Housing Stability: Unknown (10/19/2023)   Housing Stability Vital Sign    Unable to Pay for Housing in the Last Year: No    Homeless in the Last Year: No    Objective:  There were no vitals filed for this visit.  There is no height or weight on file to calculate BMI.  Physical Exam Exam conducted with a chaperone present.  Cardiovascular:     Rate and Rhythm: Normal rate.  Pulmonary:     Effort: Pulmonary effort is normal.  Chest:     Comments:  Bilateral silicone implants noted.  Bruising right upper outer quadrant breast.  Single solitary left axillary lymph node noted. Lymphadenopathy:     Upper Body:     Right upper body: No axillary adenopathy.     Left upper body: Axillary adenopathy present.  Neurological:     General: No focal deficit present.     Mental Status: She is alert.  Psychiatric:        Mood and Affect: Mood normal.        Labs, Imaging and Diagnostic Testing:  9 d ago   SURGICAL PATHOLOGY SURGICAL PATHOLOGY [facility] [address] [place], [place] [address] Telephone [phone] or [phone] Fax [phone]  REPORT OF SURGICAL PATHOLOGY   Accession #: [id number] Patient Name: Kathryn Maynard, Kathryn Maynard Visit # : [id number]  MRN: [id number] Physician: Avanell Lenis DOB/Age 12-20-1975 (Age: 33) Gender: F Collected Date: 05/06/2024 Received Date: 05/06/2024  FINAL DIAGNOSIS       1. Breast, left, needle core biopsy, 10:00, 7cmfn (Ribbon clip) :       INVASIVE LOBULAR CARCINOMA, GRADE 2 (3+2+1)      TUBULE FORMATION: SCORE 3      NUCLEAR PLEOMORPHISM: SCORE 2      MITOTIC COUNT: SCORE 1      TOTAL SCORE: 6      OVERALL GRADE: GRADE 2 (6/9)      NEGATIVE FOR ANGIOLYMPHATIC INVASION      TUMOR MEASURES 3 MM IN GREATEST LINEAR EXTENT       Diagnosis Note : An immunohistochemical stain for E-cadherin is performed with      adequate control and is negative within the tumor supporting a lobular      differentiation.  Immunohistochemical stains for the breast prognostic markers      have been ordered, and these results will be issued within a subsequent addendum      to this report.      Case reviewed by Dr. Belvie concurs with the interpretation.      Diagnosis called to Mliss at [facility] by Dr.      Reed on 05/07/2024 at 10:34 AM.      DATE SIGNED OUT: 05/08/2024 ELECTRONIC SIGNATURE : Picklesimer Md, Fred , Sports Administrator,  Electronic Signature  MICROSCOPIC DESCRIPTION  CASE COMMENTS STAINS USED IN DIAGNOSIS: H&E-2 H&E-3 H&E-4 H&E Her2 by IHC *RECUT 1 SLIDE Universal Negative Control-DAB Stains used in diagnosis 1 E-CAD, 1 ER-ACIS, 1 KI-67-ACIS, 1 PR-ACIS Some of these immunohistochemical stains may have been developed and the performance characteristics determined by [facility].  Some may not have been cleared or approved by the U.S. Food and Drug Administration.  The  FDA has determined that such clearance or approval is not necessary.  This test is used for clinical purposes.  It should not be regarded as investigational or for research.  This laboratory is certified under the Clinical Laboratory Improvement Amendments of 1988 (CLIA-88) as qualified to perform high complexity clinical laboratory testing. Estrogen receptor (6F11), immunohistochemical stains are performed on formalin fixed, paraffin embedded tissue using a 3,3-diaminobenzidine (DAB) chromogen and Leica Bond Autostainer System.  The staining intensity of the nucleus is scored manually and is reported as the percentage of tumor cell nuclei demonstrating specific nuclear staining.Specimens are fixed in 10% Neutral Buffered Formalin for at least 6 hours and up to 72 hours.  These tests have not be validated on decalcified tissue.  Results should be interpreted with caution given the possibility of false negative results on decalcified specimens. Ki-67 (MM1), immunohistochemical stains are performed on formalin fixed, paraffin embedded tissue using a 3,3-diaminobenzidine (DAB) chromogen and Leica Bond Autostainer System.  The staining intensity of the nucleus is scored manually and is reported as the percentage of tumor cell nuclei demonstrating specific nuclear staining.Specimens are fixed in 10% Neutral Buffered Formalin for at least 6 hours and up to 72 hours. These tests have not be validated on decalcified tissue.   Results should be interpreted with caution given the possibility of false negative results on decalcified specimens. PR progesterone receptor (16), immunohistochemical stains are performed on formalin fixed, paraffin embedded tissue using a 3,3-diaminobenzidine (DAB) chromogen and Leica Bond Autostainer System.  The staining intensity of the nucleus is scored manually and is reported as the percentage of tumor cell nuclei demonstrating specific nuclear staining.Specimens are fixed in 10% Neutral Buffered Formalin for at least 6 hours and up to 72 hours. These tests have not be validated on decalcified tissue.  Results should be interpreted with caution given the possibility of false negative results on decalcified specimens.  ADDENDUM 1) Breast, left, needle core biopsy, 10:00 7cmfn ( Ribbon clip) PROGNOSTIC INDICATORS  Results: IMMUNOHISTOCHEMICAL AND MORPHOMETRIC ANALYSIS PERFORMED MANUALLY The tumor cells are NEGATIVE for Her2 (1+). Estrogen Receptor:  95%, POSITIVE, STRONG STAINING INTENSITY Progesterone Receptor:  100%, POSITIVE, STRONG STAINING INTENSITY Proliferation Marker Ki67:  5% REFERENCE RANGE ESTROGEN RECEPTOR NEGATIVE     0% POSITIVE       =>1% REFERENCE RANGE PROGESTERONE RECEPTOR NEGATIVE     0% POSITIVE        =>1% All controls stained appropriately Picklesimer Md, Fred , Sports Administrator, International Aid/development Worker ( Signed [phone])   CLINICAL HISTORY  SPECIMEN(S) OBTAINED 1. Breast, left, needle core biopsy, 10:00, 7cmfn (Ribbon Clip)  SPECIMEN COMMENTS: 1. TIF: 2:07pm, CIT: <53minute SPECIMEN CLINICAL INFORMATION: 1. 6mm mass  CLINICAL DATA:  Patient is status post ultrasound-guided of the RIGHT breast in March 2025 which demonstrated mild fibrocystic change. Short-term follow-up ultrasound was recommended. Patient reports she feels the area is bigger compared to prior.   EXAM: DIGITAL DIAGNOSTIC UNILATERAL RIGHT MAMMOGRAM WITH TOMOSYNTHESIS AND CAD;  ULTRASOUND RIGHT BREAST LIMITED   TECHNIQUE: Right digital diagnostic mammography and breast tomosynthesis was performed. The images were evaluated with computer-aided detection. ; Targeted ultrasound examination of the right breast was performed   COMPARISON:  Previous exam(s).   ACR Breast Density Category c: The breasts are heterogeneously dense, which may obscure small masses.   FINDINGS: Targeted ultrasound was performed of the site of palpable concern. At 10 o'clock 7 cm from the nipple, there is an irregular hypoechoic mass with extension to the immediate subdermal  surface. Exact measurements are challenging to ascertain but it is estimated to span approximately 6 by 6 by 4 mm, previously 5 x 4 by 3 mm. No definitive internal echogenic focus is noted to suggest biopsy marking clip.   Targeted ultrasound was performed of the RIGHT axilla. No suspicious axillary lymph nodes are visualized.   On physical exam, there is a superficial firm area appreciated in the RIGHT upper outer breast. BB was placed at the site and spot tangential image was performed. This demonstrates that the COIL biopsy making clip is in near vicinity but not immediately subjacent to the site of palpable concern.   IMPRESSION: 1. There is an indeterminate 6 mm mass at the site of palpable concern. Given subjective increase in size and potentially displaced COIL biopsy marking clip, recommend repeat ultrasound-guided biopsy for definitive characterization. 2. No suspicious RIGHT axillary adenopathy.   RECOMMENDATION: RIGHT breast ultrasound-guided biopsy x1   I have discussed the findings and recommendations with the patient. The biopsy procedure was discussed with the patient and questions were answered. Patient expressed their understanding of the biopsy recommendation. Patient will be scheduled for biopsy at her earliest convenience by the schedulers. Ordering provider will be notified. If  applicable, a reminder letter will be sent to the patient regarding the next appointment.   BI-RADS CATEGORY  4: Suspicious.     Electronically Signed   By: Corean Salter M.D.   On: 05/02/2024 14:00 Assessment and Plan:     Diagnoses and all orders for this visit:  Breast cancer, stage 1, right (CMS/HHS-HCC)    Recommend MRI  She is interested in doing breast conserving surgery.  I discussed also plastic surgery and revision of implants.  Discussed that lymph node mapping.  Discussed the significance of the mildly enlarged left axillar lymph node.  Plan will be to proceed with right breast seed lumpectomy with right axillary sentinel lymph node mapping but she is aware she may require further biopsies.  She also is aware that with sent lymph node mapping there can be some discoloration of breast.  We discussed cosmesis and potential implant rupture and/or injury.  She would like to conserve her implants at this point in time and proceed with breast conserving surgery.  Refer to medical and radiation oncology, may need genetics, refer to PT The procedure has been discussed with the patient. Alternatives to surgery have been discussed with the patient.  Risks of surgery include bleeding, lymphedema, shoulder pain, numbness, infection,  Seroma formation, death,  and the need for further surgery.   The patient understands and wishes to proceed.    DEBBY CURTISTINE SHIPPER, MD    I spent a total of 54 minutes in both face-to-face and non-face-to-face activities, excluding procedures performed, for this visit on the date of this encounter.      "

## 2024-05-17 ENCOUNTER — Encounter: Payer: Self-pay | Admitting: *Deleted

## 2024-05-17 ENCOUNTER — Other Ambulatory Visit: Payer: Self-pay | Admitting: Surgery

## 2024-05-17 DIAGNOSIS — C50211 Malignant neoplasm of upper-inner quadrant of right female breast: Secondary | ICD-10-CM | POA: Insufficient documentation

## 2024-05-17 DIAGNOSIS — C50212 Malignant neoplasm of upper-inner quadrant of left female breast: Secondary | ICD-10-CM | POA: Insufficient documentation

## 2024-05-17 DIAGNOSIS — C50911 Malignant neoplasm of unspecified site of right female breast: Secondary | ICD-10-CM

## 2024-05-17 NOTE — Progress Notes (Signed)
 Called patient as scheduler stated that patient had questions about her appts. She is aware of MRI on Monday. PT referral entered for baseline eval; sozo. Patient is aware of appt with Dr. Loretha. Introduced myself as navigator and told her my contact information. Patient sounded a bit overwhelmed and anxious understandably with all her appts and new diagnosis. She was appreciative of my call and knows she can call anytime with needs/concerns.

## 2024-05-20 ENCOUNTER — Other Ambulatory Visit

## 2024-05-21 ENCOUNTER — Ambulatory Visit
Admission: RE | Admit: 2024-05-21 | Discharge: 2024-05-21 | Disposition: A | Source: Ambulatory Visit | Attending: Surgery | Admitting: Surgery

## 2024-05-21 DIAGNOSIS — C50911 Malignant neoplasm of unspecified site of right female breast: Secondary | ICD-10-CM

## 2024-05-21 MED ORDER — GADOPICLENOL 0.5 MMOL/ML IV SOLN
5.0000 mL | Freq: Once | INTRAVENOUS | Status: AC | PRN
  Administered 2024-05-21: 5 mL via INTRAVENOUS

## 2024-05-21 NOTE — Therapy (Signed)
 " OUTPATIENT PHYSICAL THERAPY BREAST CANCER BASELINE EVALUATION   Patient Name: Kathryn Maynard MRN: [id number] DOB:1976/02/23, 49 y.o., female Today's Date: 05/22/2024  END OF SESSION:  PT End of Session - 05/22/24 1500     Visit Number 1    Number of Visits 2    Date for Recertification  07/17/24    PT Start Time 1501    PT Stop Time 1600    PT Time Calculation (min) 59 min    Activity Tolerance Patient tolerated treatment well    Behavior During Therapy WFL for tasks assessed/performed          Past Medical History:  Diagnosis Date   Acne    Hx of Accutane  Therapy   Actinic keratosis    Anemia    Chronic cystitis    History of endometriosis    IBS (irritable bowel syndrome)    Lesion of pons    followed by neuro   Migraine headache with aura followed by neuro   Past Surgical History:  Procedure Laterality Date   BREAST BIOPSY Right 06/26/2023   US  RT BREAST BX W LOC DEV 1ST LESION IMG BX SPEC US  GUIDE 06/26/2023 [facility]   BREAST BIOPSY Right 05/06/2024   US  RT BREAST BX W LOC DEV 1ST LESION IMG BX SPEC US  GUIDE 05/06/2024 [facility]   LAPAROSCOPY     for endometriosis   Patient Active Problem List   Diagnosis Date Noted   Malignant neoplasm of upper-inner quadrant of left breast in female, estrogen receptor positive (HCC) 05/17/2024   Migraine headache with aura    Lesion of pons    Anemia    IBS (irritable bowel syndrome)    Chronic cystitis       REFERRING PROVIDER: Dr. Debby Shipper  REFERRING DIAG: Right Breast Cancer  THERAPY DIAG:  Malignant neoplasm of upper-inner quadrant of left breast in female, estrogen receptor positive (HCC) - Plan: PT plan of care cert/re-cert  Abnormal posture - Plan: PT plan of care cert/re-cert  Rationale for Evaluation and Treatment: Rehabilitation  ONSET DATE: 05/08/2024  SUBJECTIVE:                                                                                                                                                                                            SUBJECTIVE STATEMENT: Patient reports she is here today to be seen by her medical team for her newly diagnosed right breast cancer. PERTINENT HISTORY:  Patient was diagnosed on 05/08/2024 with right grade 2 IDC. It measures .6 cm and is located in the upper outer quadrant. It is ER+, PR+, Her  2 neg with a Ki67 of 5%. She has bilateral subpectoral implants that are atleast 50 years old. She would like to preserve them. She is scheduled for right lumpectomy with SLNB on 06/13/2024.  PATIENT GOALS:   reduce lymphedema risk and learn post op HEP.   PAIN:  Are you having pain? No  PRECAUTIONS: Active CA   RED FLAGS: None   HAND DOMINANCE: right  WEIGHT BEARING RESTRICTIONS: No  FALLS:  Has patient fallen in last 6 months? 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  COGNITION: Overall cognitive status: Within functional limits for tasks assessed    POSTURE:  Forward head and rounded shoulders posture  UPPER EXTREMITY AROM/PROM:  A/PROM RIGHT   eval   Shoulder extension 65  Shoulder flexion 159  Shoulder abduction 180  Shoulder internal rotation 70  Shoulder external rotation 103    (Blank rows = not tested)  A/PROM LEFT   eval  Shoulder extension 64  Shoulder flexion 161  Shoulder abduction 180  Shoulder internal rotation 70  Shoulder external rotation 98    (Blank rows = not tested)  CERVICAL AROM: All within normal limits:   UPPER EXTREMITY STRENGTH: WNL  LYMPHEDEMA ASSESSMENTS (in cm):   LANDMARK RIGHT   eval  10 cm proximal to  olecranon process from proximal aspect of olecranon 24.0  Olecranon process 22.0  10 cm proximal to ulnar styloid process from proximal aspect of styloid process 19.4  Just distal to ulnar styloid process 14.2  Across hand at thumb web space 18.0  At base of 2nd digit 6.0  (Blank rows = not tested)  LANDMARK LEFT   eval  10 cm proximal to olecranon process from proximal aspect of olecranon 24.5   Olecranon process 22.1  10 cm proximal to ulnar styloid process from proximal aspect of styloid process 19.1  Just distal to ulnar styloid process 14.3  Across hand at thumb web space 18.5  At base of 2nd digit 6.0  (Blank rows = not tested)  L-DEX LYMPHEDEMA SCREENING:  The patient was assessed using the L-Dex machine today to produce a lymphedema index baseline score. The patient will be reassessed on a regular basis (typically every 3 months) to obtain new L-Dex scores. If the score is > 6.5 points away from his/her baseline score indicating onset of subclinical lymphedema, it will be recommended to wear a compression garment for 4 weeks, 12 hours per day and then be reassessed. If the score continues to be > 6.5 points from baseline at reassessment, we will initiate lymphedema treatment. Assessing in this manner has a 95% rate of preventing clinically significant lymphedema.   L-DEX FLOWSHEETS - 05/22/24 1500       L-DEX LYMPHEDEMA SCREENING   Measurement Type Unilateral    L-DEX MEASUREMENT EXTREMITY Upper Extremity    POSITION  Standing    DOMINANT SIDE Right    At Risk Side Right    BASELINE SCORE (UNILATERAL) -2.4          QUICK DASH SURVEY: 0%  PATIENT EDUCATION:  Education details: Time spent educating patient on aspects of self-care to maximize post op recovery. Patient was  educated on where and how to get a post op compression bra to use to reduce post op edema. Patient was also educated on the use of SOZO screenings and surveillance principles for early identification of  lymphedema onset. She was instructed to use the post op pillow in the axilla for pressure and pain relief. Patient educated on lymphedema risk reduction and post op shoulder/posture HEP. Person educated: Patient Education method: Explanation, Demonstration, Handout Education comprehension: Patient verbalized understanding and returned demonstration  HOME EXERCISE PROGRAM: Patient was instructed today in a home exercise program today for post op shoulder range of motion. These included active assist shoulder flexion in sitting, scapular retraction, wall walking with shoulder abduction, and hands behind head external rotation.  She was encouraged to do these twice a day, holding 3 seconds and repeating 5 times when permitted by her physician.   ASSESSMENT:  CLINICAL IMPRESSION: Pts multidisciplinary medical team met prior to her assessments to determine a recommended treatment plan. She is planning to have a right lumpectomy with SLNB on 06/13/2024. She has implants presently and hopes to preserve these. She will benefit from a post op PT reassessment to determine needs and from L-Dex screens every 3 months for 2 years to detect subclinical lymphedema.  Pt will benefit from skilled therapeutic intervention to improve on the following deficits: Decreased knowledge of precautions, impaired UE functional use, pain, decreased ROM, postural dysfunction.   PT treatment/interventions: ADL/self-care home management, pt/family education, therapeutic exercise  REHAB POTENTIAL: Excellent  CLINICAL DECISION MAKING: Stable/uncomplicated  EVALUATION COMPLEXITY: Low   GOALS: Goals reviewed with patient? YES  LONG TERM GOALS: (STG=LTG)    Name Target Date Goal status  1 Pt will be able to verbalize understanding of pertinent lymphedema risk reduction practices relevant to her dx specifically related to skin care.  Baseline:  No knowledge 05/22/2024 Achieved at eval  2 Pt will be able to return demo  and/or verbalize understanding of the post op HEP related to regaining shoulder ROM. Baseline:  No knowledge 05/22/2024 Achieved at eval  3 Pt will be able to verbalize understanding of the importance of viewing the post op After Breast CA Class video for further lymphedema risk reduction education and therapeutic exercise.  Baseline:  No knowledge 05/22/2024 Achieved at eval  4 Pt will demo she has regained full shoulder ROM and function post operatively compared to baselines.  Baseline: See objective measurements taken today. 07/17/2024 NEW    PLAN:  PT FREQUENCY/DURATION: EVAL and 1 follow up appointment.   PLAN FOR NEXT SESSION: will reassess 3-4 weeks post op to determine needs.   Patient will follow up at outpatient cancer rehab 3-4 weeks following surgery.  If the patient requires physical therapy at that time, a specific plan will be dictated and sent to the referring physician for approval. The patient was educated today on appropriate basic range of motion exercises to begin post operatively and the importance of viewing the After Breast Cancer class video following surgery.  Patient was educated today on lymphedema risk reduction practices as it pertains to recommendations that will benefit the patient immediately following surgery.  She verbalized good understanding.    Physical Therapy Information for After Breast Cancer Surgery/Treatment:  Lymphedema is a swelling condition that you may be at risk for in your arm if you have lymph nodes removed from the armpit area.  After a sentinel node biopsy, the risk is approximately 5-9% and is higher after an axillary node dissection.  There is  treatment available for this condition and it is not life-threatening.  Contact your physician or physical therapist with concerns. You may begin the 4 shoulder/posture exercises (see additional sheet) when permitted by your physician (typically a week after surgery).  If you have drains, you may need to  wait until those are removed before beginning range of motion exercises.  A general recommendation is to not lift your arms above shoulder height until drains are removed.  These exercises should be done to your tolerance and gently.  This is not a no pain/no gain type of recovery so listen to your body and stretch into the range of motion that you can tolerate, stopping if you have pain.  If you are having immediate reconstruction, ask your plastic surgeon about doing exercises as he or she may want you to wait. We encourage you to view the After Breast Cancer class video following surgery.  You will learn information related to lymphedema risk, prevention and treatment and additional exercises to regain mobility following surgery.   While undergoing any medical procedure or treatment, try to avoid blood pressure being taken or needle sticks from occurring on the arm on the side of cancer.   This recommendation begins after surgery and continues for the rest of your life.  This may help reduce your risk of getting lymphedema (swelling in your arm). An excellent resource for those seeking information on lymphedema is the National Lymphedema Network's web site. It can be accessed at www.lymphnet.org If you notice swelling in your hand, arm or breast at any time following surgery (even if it is many years from now), please contact your doctor or physical therapist to discuss this.  Lymphedema can be treated at any time but it is easier for you if it is treated early on.  If you feel like your shoulder motion is not returning to normal in a reasonable amount of time, please contact your surgeon or physical therapist.  [facility] [phone]. [address], [place] [address]  [facility] CLASS After Breast Cancer Class  After Breast Cancer Class is a specially designed exercise class video to assist you in a safe recover after having breast cancer surgery.  In this  video you will learn how to get back to full function whether your drains were just removed or if you had surgery a month ago. The video can be viewed on this page: https://www.boyd-meyer.org/ or on YouTube here: https://youtu.az/[email or web address].  Class Goals  Understand specific stretches to improve the flexibility of you chest and shoulder. Learn ways to safely strengthen your upper body and improve your posture. Understand the warning signs of infection and why you may be at risk for an arm infection. Learn about Lymphedema and prevention.  ** You do not need to view this video until after surgery.  Drains should be removed to participate in the recommended exercises on the video.  Patient was instructed today in a home exercise program today for post op shoulder range of motion. These included active assist shoulder flexion in sitting, scapular retraction, wall walking with shoulder abduction, and hands behind head external rotation.  She was encouraged to do these twice a day, holding 3 seconds and repeating 5 times when permitted by her physician.    Grayce JINNY Sheldon, PT 05/22/2024, 4:09 PM   "

## 2024-05-22 ENCOUNTER — Telehealth: Payer: Self-pay | Admitting: Radiation Oncology

## 2024-05-22 ENCOUNTER — Ambulatory Visit: Attending: Surgery

## 2024-05-22 ENCOUNTER — Other Ambulatory Visit: Payer: Self-pay

## 2024-05-22 DIAGNOSIS — R293 Abnormal posture: Secondary | ICD-10-CM | POA: Diagnosis present

## 2024-05-22 DIAGNOSIS — C50212 Malignant neoplasm of upper-inner quadrant of left female breast: Secondary | ICD-10-CM | POA: Diagnosis present

## 2024-05-22 DIAGNOSIS — Z170 Estrogen receptor positive status [ER+]: Secondary | ICD-10-CM | POA: Insufficient documentation

## 2024-05-22 NOTE — Telephone Encounter (Signed)
 Called pt to schedule consult with Dr. Maritza. Pt requested slight delay due to her work schedule; agreeable to 2/5 @ 12:30pm.   Pt advised she has not received a call regarding 2/17 appt. I advised I would pass this information along to nurse navigator Waverly Hall to make sure pt received call and questions regarding this appt are answered.

## 2024-05-24 ENCOUNTER — Encounter: Payer: Self-pay | Admitting: *Deleted

## 2024-05-24 NOTE — Progress Notes (Signed)
 Called patient back about her upcoming appts. She now is aware of all future appts. Has navigator's contact number. No current questions or concerns at this time.

## 2024-05-30 ENCOUNTER — Ambulatory Visit
Admission: RE | Admit: 2024-05-30 | Discharge: 2024-05-30 | Attending: Radiation Oncology | Admitting: Radiation Oncology

## 2024-05-30 ENCOUNTER — Ambulatory Visit
Admission: RE | Admit: 2024-05-30 | Discharge: 2024-05-30 | Disposition: A | Source: Ambulatory Visit | Attending: Radiation Oncology

## 2024-05-30 ENCOUNTER — Encounter: Payer: Self-pay | Admitting: Radiation Oncology

## 2024-05-30 VITALS — BP 110/80 | HR 72 | Temp 97.5°F | Resp 16 | Wt 117.6 lb

## 2024-05-30 DIAGNOSIS — C50211 Malignant neoplasm of upper-inner quadrant of right female breast: Secondary | ICD-10-CM

## 2024-05-30 DIAGNOSIS — C50212 Malignant neoplasm of upper-inner quadrant of left female breast: Secondary | ICD-10-CM

## 2024-05-30 NOTE — Progress Notes (Addendum)
 Location of Breast Cancer: Malignant neoplasm of upper-inner quadrant of right breast in female, estrogen receptor positive (HCC)  Histology per Pathology Report:  FINAL DIAGNOSIS       1. Breast, left, needle core biopsy, 10:00, 7cmfn (Ribbon clip) :      INVASIVE LOBULAR CARCINOMA, GRADE 2 (3+2+1)      TUBULE FORMATION: SCORE 3      NUCLEAR PLEOMORPHISM: SCORE 2      MITOTIC COUNT: SCORE 1      TOTAL SCORE: 6      OVERALL GRADE: GRADE 2 (6/9)      NEGATIVE FOR ANGIOLYMPHATIC INVASION      TUMOR MEASURES 3 MM IN GREATEST LINEAR EXTENT       Diagnosis Note : An immunohistochemical stain for E-cadherin is performed with      adequate control and is negative within the tumor supporting a lobular      differentiation.  Immunohistochemical stains for the breast prognostic markers      have been ordered, and these results will be issued within a subsequent addendum      to this report.      Case reviewed by Dr. Belvie concurs with the interpretation.      Diagnosis called to [facility] at [facility] by Dr.      Reed on 05/07/2024 at 10:34 AM.  Receptor Status: ER(positive), PR (positive at 100%), Her2-neu (), Ki-67(5%)  Did patient present with symptoms (if so, please note symptoms) or was this found on screening mammography?: Patient did a manual test. Then screening mammogram confirmed it. Bilateral diagnostic mammogram   Past/Anticipated interventions by surgeon, if any:  Past/Anticipated interventions by medical oncology, if any: Chemotherapy   Lymphedema issues, if any:     Right  Cornett, Debby, MD  Pain issues, if any:  Denies  SAFETY ISSUES: Prior radiation? No Pacemaker/ICD? No Possible current pregnancy?No Is the patient on methotrexate? No  Current Complaints / other details:  Patient has breast implants.      BP 110/80 (BP Location: Left Arm, Patient Position: Sitting)   Pulse 72   Temp (!) 97.5 F (36.4 C) (Temporal)   Resp 16    Wt 117 lb 9.6 oz (53.3 kg)   SpO2 100%   BMI 22.22 kg/m

## 2024-05-30 NOTE — Addendum Note (Signed)
 Encounter addended by: Maritza Stagger, MD on: 05/30/2024 4:34 PM  Actions taken: Problem List modified, Visit diagnoses modified, Clinical Note Signed

## 2024-05-30 NOTE — Addendum Note (Signed)
 Encounter addended by: Claudene Eudora GAILS, LPN on: 10/27/7971 4:38 PM  Actions taken: Clinical Note Signed

## 2024-05-31 ENCOUNTER — Encounter: Payer: Self-pay | Admitting: *Deleted

## 2024-05-31 NOTE — Progress Notes (Signed)
 Dr. Maritza requested plastic consult to Dr. Arelia. Email referral request placed.

## 2024-06-04 ENCOUNTER — Inpatient Hospital Stay

## 2024-06-04 ENCOUNTER — Inpatient Hospital Stay: Admitting: Hematology and Oncology

## 2024-06-11 ENCOUNTER — Encounter

## 2024-06-13 ENCOUNTER — Encounter (HOSPITAL_BASED_OUTPATIENT_CLINIC_OR_DEPARTMENT_OTHER): Payer: Self-pay

## 2024-06-13 ENCOUNTER — Encounter

## 2024-06-13 ENCOUNTER — Ambulatory Visit (HOSPITAL_BASED_OUTPATIENT_CLINIC_OR_DEPARTMENT_OTHER): Admit: 2024-06-13 | Admitting: Surgery

## 2024-07-08 ENCOUNTER — Ambulatory Visit: Payer: Self-pay
